# Patient Record
Sex: Male | Born: 2000 | Race: White | Hispanic: No | Marital: Single | Smoking: Never smoker
Health system: Southern US, Community
[De-identification: ages and names within clinical notes are randomized; demographics above are authoritative.]

## PROBLEM LIST (undated history)

## (undated) HISTORY — PX: FOREIGN BODY REMOVAL EAR: SHX5321

---
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
## 2019-02-16 ENCOUNTER — Other Ambulatory Visit: Payer: Self-pay

## 2019-02-16 ENCOUNTER — Encounter: Payer: Self-pay | Admitting: Emergency Medicine

## 2019-02-16 ENCOUNTER — Emergency Department
Admission: EM | Admit: 2019-02-16 | Discharge: 2019-02-16 | Disposition: A | Discharge status: Home / Self Care | Payer: Medicaid Other | Attending: Emergency Medicine | Admitting: Emergency Medicine

## 2019-02-16 DIAGNOSIS — L539 Erythematous condition, unspecified: Secondary | ICD-10-CM | POA: Diagnosis present

## 2019-02-16 DIAGNOSIS — Z79899 Other long term (current) drug therapy: Secondary | ICD-10-CM | POA: Diagnosis not present

## 2019-02-16 DIAGNOSIS — L089 Local infection of the skin and subcutaneous tissue, unspecified: Secondary | ICD-10-CM | POA: Diagnosis not present

## 2019-02-16 MED ORDER — CEPHALEXIN 500 MG PO CAPS: 500.0000 mg | ORAL_CAPSULE | Freq: Three times a day (TID) | ORAL | 0 refills | Status: DC

## 2019-02-16 NOTE — ED Notes (Signed)
See triage note   Presents with redness and some swelling to new tattoo area

## 2019-02-16 NOTE — ED Provider Notes (Signed)
[facility] Emergency Department Provider Note   ____________________________________________   First MD Initiated Contact with Patient 02/16/19 1023     (approximate)  I have reviewed the triage vital signs and the nursing notes.   HISTORY  Chief Complaint Arm Pain   HPI Mark Butler is a 19 y.o. male presents to the ED with complaint of redness around a new tattoo that he got approximately 4 days ago.  Patient states he tried a new method and has been noting some redness along the edges of the tattoo.  He is denies any fever or chills.     History reviewed. No pertinent past medical history.  There are no problems to display for this patient.   Past Surgical History:  Procedure Laterality Date  . FOREIGN BODY REMOVAL EAR    . KNEE ARTHROSCOPY WITH MENISCAL REPAIR Left 11/25/2014   Procedure: KNEE ARTHROSCOPY WITH MENISCAL REPAIR;  Surgeon: Corky Mull, MD;  Location: [facility] ORS;  Service: Orthopedics;  Laterality: Left;  . PATELLAR TENDON REPAIR Left 11/25/2014   Procedure: PATELLA TENDON REPAIR/ PATELLOFEMORAL LIGAMENT;  Surgeon: Corky Mull, MD;  Location: [facility] ORS;  Service: Orthopedics;  Laterality: Left;    Prior to Admission medications   Medication Sig Start Date End Date Taking? Authorizing Provider  albuterol (VENTOLIN HFA) 108 (90 Base) MCG/ACT inhaler Inhale 2 puffs into the lungs every 4 (four) hours as needed for wheezing or shortness of breath. 10/23/18   Duffy Bruce, MD  cephALEXin (KEFLEX) 500 MG capsule Take 1 capsule (500 mg total) by mouth 3 (three) times daily. 02/16/19   Johnn Hai, PA-C    Allergies Patient has no known allergies.  No family history on file.  Social History Social History   Tobacco Use  . Smoking status: Never Smoker  . Smokeless tobacco: Never Used  Substance Use Topics  . Alcohol use: Never  . Drug use: Never    Review of Systems Constitutional: No fever/chills  Cardiovascular: Denies chest pain. Respiratory: Denies shortness of breath. Musculoskeletal: Negative for back pain. Skin: Tattoos x2 with redness. Neurological: Negative for headaches ___________________________________________   PHYSICAL EXAM:  VITAL SIGNS: ED Triage Vitals  Enc Vitals Group     BP --      Pulse --      Resp --      Temp --      Temp src --      SpO2 --      Weight 02/16/19 1006 145 lb 1 oz (65.8 kg)     Height 02/16/19 1006 5\' 10"  (1.778 m)     Head Circumference --      Peak Flow --      Pain Score 02/16/19 1005 5     Pain Loc --      Pain Edu? --      Excl. in [place]? --    Constitutional: Alert and oriented. Well appearing and in no acute distress. Eyes: Conjunctivae are normal.  Head: Atraumatic. Neck: No stridor.   Cardiovascular:   Good peripheral circulation. Respiratory: Normal respiratory effort.  No retractions. Musculoskeletal: Moves upper and lower extremities without any difficulty.  Normal gait was noted. Neurologic:  Normal speech and language. No gross focal neurologic deficits are appreciated. No gait instability. Skin:  Skin is warm, dry and intact. No rash noted. Psychiatric: Mood and affect are normal. Speech and behavior are normal.  ____________________________________________   LABS (all labs ordered are listed, but only  abnormal results are displayed)  Labs Reviewed - No data to display   PROCEDURES  Procedure(s) performed (including Critical Care):  Procedures   ____________________________________________   INITIAL IMPRESSION / ASSESSMENT AND PLAN / ED COURSE  As part of my medical decision making, I reviewed the following data within the electronic MEDICAL RECORD NUMBER Notes from prior ED visits and [place] Controlled Substance Database  19 year old male presents to the ED with erythema in the area where he had tattoos done 4 days ago.  Patient denies any fever or chills.  There is been no drainage from the area.  Patient  denies any itching.  The edges of the tattoo are erythematous but no drainage is noted.  Patient was made aware that he needs to watch for any extension of the redness.  He was started on Keflex 500 mg 3 times daily for 7 days.  He was instructed to follow-up with his PCP or return to the emergency department if any severe worsening of his symptoms. ____________________________________________   FINAL CLINICAL IMPRESSION(S) / ED DIAGNOSES  Final diagnoses:  Local skin infection     ED Discharge Orders         Ordered    cephALEXin (KEFLEX) 500 MG capsule  3 times daily     02/16/19 1042           Note:  This document was prepared using Dragon voice recognition software and may include unintentional dictation errors.    Tommi Rumps, PA-C 02/16/19 1102    Concha Se, MD 02/17/19 1051

## 2019-02-16 NOTE — ED Triage Notes (Signed)
Tattoo done four days ago to right forearm.  Some redness noted and slight swelling to area.

## 2019-02-16 NOTE — Discharge Instructions (Addendum)
Follow-up with your primary care provider if any continued problems or concerns.  Keep the area clean and dry.  Begin taking antibiotics as directed until completely finished.  If there is any worsening of your skin infection such as fever, chills or extension of the redness return to the emergency department.

## 2019-12-05 ENCOUNTER — Emergency Department
Admission: EM | Admit: 2019-12-05 | Discharge: 2019-12-05 | Disposition: A | Discharge status: Home / Self Care | Payer: Medicaid Other | Attending: Emergency Medicine | Admitting: Emergency Medicine

## 2019-12-05 ENCOUNTER — Other Ambulatory Visit: Payer: Self-pay

## 2019-12-05 ENCOUNTER — Encounter: Payer: Self-pay | Admitting: Emergency Medicine

## 2019-12-05 DIAGNOSIS — K0889 Other specified disorders of teeth and supporting structures: Secondary | ICD-10-CM | POA: Diagnosis present

## 2019-12-05 MED ORDER — IBUPROFEN 600 MG PO TABS
600.0000 mg | ORAL_TABLET | Freq: Once | ORAL | Status: AC
  Administered 2019-12-05: 600 mg via ORAL
  Filled 2019-12-05: qty 1

## 2019-12-05 MED ORDER — IBUPROFEN 600 MG PO TABS: 600.0000 mg | ORAL_TABLET | Freq: Three times a day (TID) | ORAL | 0 refills | Status: DC | PRN

## 2019-12-05 MED ORDER — TRAMADOL HCL 50 MG PO TABS
50.0000 mg | ORAL_TABLET | Freq: Once | ORAL | Status: AC
  Administered 2019-12-05: 50 mg via ORAL
  Filled 2019-12-05: qty 1

## 2019-12-05 MED ORDER — AMOXICILLIN 500 MG PO CAPS: 500.0000 mg | ORAL_CAPSULE | Freq: Three times a day (TID) | ORAL | 0 refills | Status: DC

## 2019-12-05 NOTE — Discharge Instructions (Signed)
Follow discharge care instruction take medication as directed.  Follow-up with treating dentist for reevaluation.

## 2019-12-05 NOTE — ED Notes (Signed)
Patient to ED with complaint of jaw pain. States he had his wisdom teeth out October 25th and thinks it may be related to that. No facial swelling or lip swelling noted.

## 2019-12-05 NOTE — ED Triage Notes (Signed)
Pt to ED via POV, pt has swelling to the left side of his face. Pt had his wisdom teeth removed in October and thinks this may be related. Pt states that the area is painful and he is not able to open his mouth all the way due to the pain and swelling. Pt is in NAD.

## 2019-12-05 NOTE — ED Provider Notes (Signed)
[facility] Emergency Department Provider Note   ____________________________________________   First MD Initiated Contact with Patient 12/05/19 [phone]     (approximate)  I have reviewed the triage vital signs and the nursing notes.   HISTORY  Chief Complaint Dental Pain    HPI Mark Butler is a 19 y.o. male patient complain of edema to the lateral left molar area.  Patient states pain started a few days ago.  Patient had bilateral upper and lower wisdom tooth extraction last month.  Patient said he was doing fine until a few days ago.  Patient had no complaint with the right side.  Patient pain is a 4/10.  Described pain as "achy".  Patient state unable to fully open his mouth secondary to pain.  No palliative measures for complaint.         History reviewed. No pertinent past medical history.  There are no problems to display for this patient.   Past Surgical History:  Procedure Laterality Date  . FOREIGN BODY REMOVAL EAR    . KNEE ARTHROSCOPY WITH MENISCAL REPAIR Left 11/25/2014   Procedure: KNEE ARTHROSCOPY WITH MENISCAL REPAIR;  Surgeon: Christena Flake, MD;  Location: [facility] ORS;  Service: Orthopedics;  Laterality: Left;  . PATELLAR TENDON REPAIR Left 11/25/2014   Procedure: PATELLA TENDON REPAIR/ PATELLOFEMORAL LIGAMENT;  Surgeon: Christena Flake, MD;  Location: [facility] ORS;  Service: Orthopedics;  Laterality: Left;    Prior to Admission medications   Medication Sig Start Date End Date Taking? Authorizing Provider  albuterol (VENTOLIN HFA) 108 (90 Base) MCG/ACT inhaler Inhale 2 puffs into the lungs every 4 (four) hours as needed for wheezing or shortness of breath. 10/23/18   Shaune Pollack, MD  amoxicillin (AMOXIL) 500 MG capsule Take 1 capsule (500 mg total) by mouth 3 (three) times daily. 12/05/19   Joni Reining, PA-C  cephALEXin (KEFLEX) 500 MG capsule Take 1 capsule (500 mg total) by mouth 3 (three) times daily. 02/16/19   Tommi Rumps, PA-C  ibuprofen (ADVIL) 600 MG tablet Take 1 tablet (600 mg total) by mouth every 8 (eight) hours as needed. 12/05/19   Joni Reining, PA-C    Allergies Patient has no known allergies.  No family history on file.  Social History Social History   Tobacco Use  . Smoking status: Never Smoker  . Smokeless tobacco: Never Used  Vaping Use  . Vaping Use: Every day  Substance Use Topics  . Alcohol use: Never  . Drug use: Yes    Types: Marijuana    Review of Systems Constitutional: No fever/chills Eyes: No visual changes. ENT: No sore throat.  Left dental pain. Cardiovascular: Denies chest pain. Respiratory: Denies shortness of breath. Gastrointestinal: No abdominal pain.  No nausea, no vomiting.  No diarrhea.  No constipation. Genitourinary: Negative for dysuria. Musculoskeletal: Negative for back pain. Skin: Negative for rash. Neurological: Negative for headaches, focal weakness or numbness.   ____________________________________________   PHYSICAL EXAM:  VITAL SIGNS: ED Triage Vitals  Enc Vitals Group     BP 12/05/19 0953 (!) 96/55     Pulse Rate 12/05/19 0953 82     Resp 12/05/19 0953 16     Temp 12/05/19 0953 97.7 F (36.5 C)     Temp Source 12/05/19 0953 Oral     SpO2 12/05/19 0953 97 %     Weight 12/05/19 0951 138 lb (62.6 kg)     Height 12/05/19 0951 5\' 11"  (1.803 m)  Head Circumference --      Peak Flow --      Pain Score 12/05/19 0950 4     Pain Loc --      Pain Edu? --      Excl. in [place]? --    Constitutional: Alert and oriented. Well appearing and in no acute distress. Mouth/Throat: Mucous membranes are moist.  Oropharynx non-erythematous. Hematological/Lymphatic/Immunilogical: No cervical lymphadenopathy. Cardiovascular: Normal rate, regular rhythm. Grossly normal heart sounds.  Good peripheral circulation. Respiratory: Normal respiratory effort.  No retractions. Lungs CTAB. Skin:  Skin is warm, dry and intact. No rash noted.  Mild edema left  lateral mandible area. Psychiatric: Mood and affect are normal. Speech and behavior are normal.  ____________________________________________   LABS (all labs ordered are listed, but only abnormal results are displayed)  Labs Reviewed - No data to display ____________________________________________  EKG   ____________________________________________  RADIOLOGY I, Joni Reining, personally viewed and evaluated these images (plain radiographs) as part of my medical decision making, as well as reviewing the written report by the radiologist.  ED MD interpretation:    Official radiology report(s): No results found.  ____________________________________________   PROCEDURES  Procedure(s) performed (including Critical Care):  Procedures   ____________________________________________   INITIAL IMPRESSION / ASSESSMENT AND PLAN / ED COURSE  As part of my medical decision making, I reviewed the following data within the electronic MEDICAL RECORD NUMBER         Patient presents with left lateral molar pain for 2 days.  Patient denies fever associated complaint.  Patient complaint physical exam consistent with dental pain.  Patient given a prophylactic prescription for amoxicillin.  Patient also given prescription for ibuprofen.  Patient will follow up with treating dentist by calling for reevaluation appointment in 2 days.  Return to ED if condition worsens.      ____________________________________________   FINAL CLINICAL IMPRESSION(S) / ED DIAGNOSES  Final diagnoses:  Pain, dental     ED Discharge Orders         Ordered    amoxicillin (AMOXIL) 500 MG capsule  3 times daily        12/05/19 1002    ibuprofen (ADVIL) 600 MG tablet  Every 8 hours PRN        12/05/19 1002          *Please note:  [name] was evaluated in Emergency Department on 12/05/2019 for the symptoms described in the history of present illness. He was evaluated in the context of  the global COVID-19 pandemic, which necessitated consideration that the patient might be at risk for infection with the SARS-CoV-2 virus that causes COVID-19. Institutional protocols and algorithms that pertain to the evaluation of patients at risk for COVID-19 are in a state of rapid change based on information released by regulatory bodies including the CDC and federal and state organizations. These policies and algorithms were followed during the patient's care in the ED.  Some ED evaluations and interventions may be delayed as a result of limited staffing during and the pandemic.*   Note:  This document was prepared using Dragon voice recognition software and may include unintentional dictation errors.    Joni Reining, PA-C 12/05/19 1007    Dionne Bucy, MD 12/05/19 1049

## 2020-04-19 IMAGING — CR DG CHEST 2V
2 series · 2 of 2 positions shown · non-contrast
Comparison: None.

CLINICAL DATA: Chest pain

EXAM:
CHEST - 2 VIEW

[chest pa]
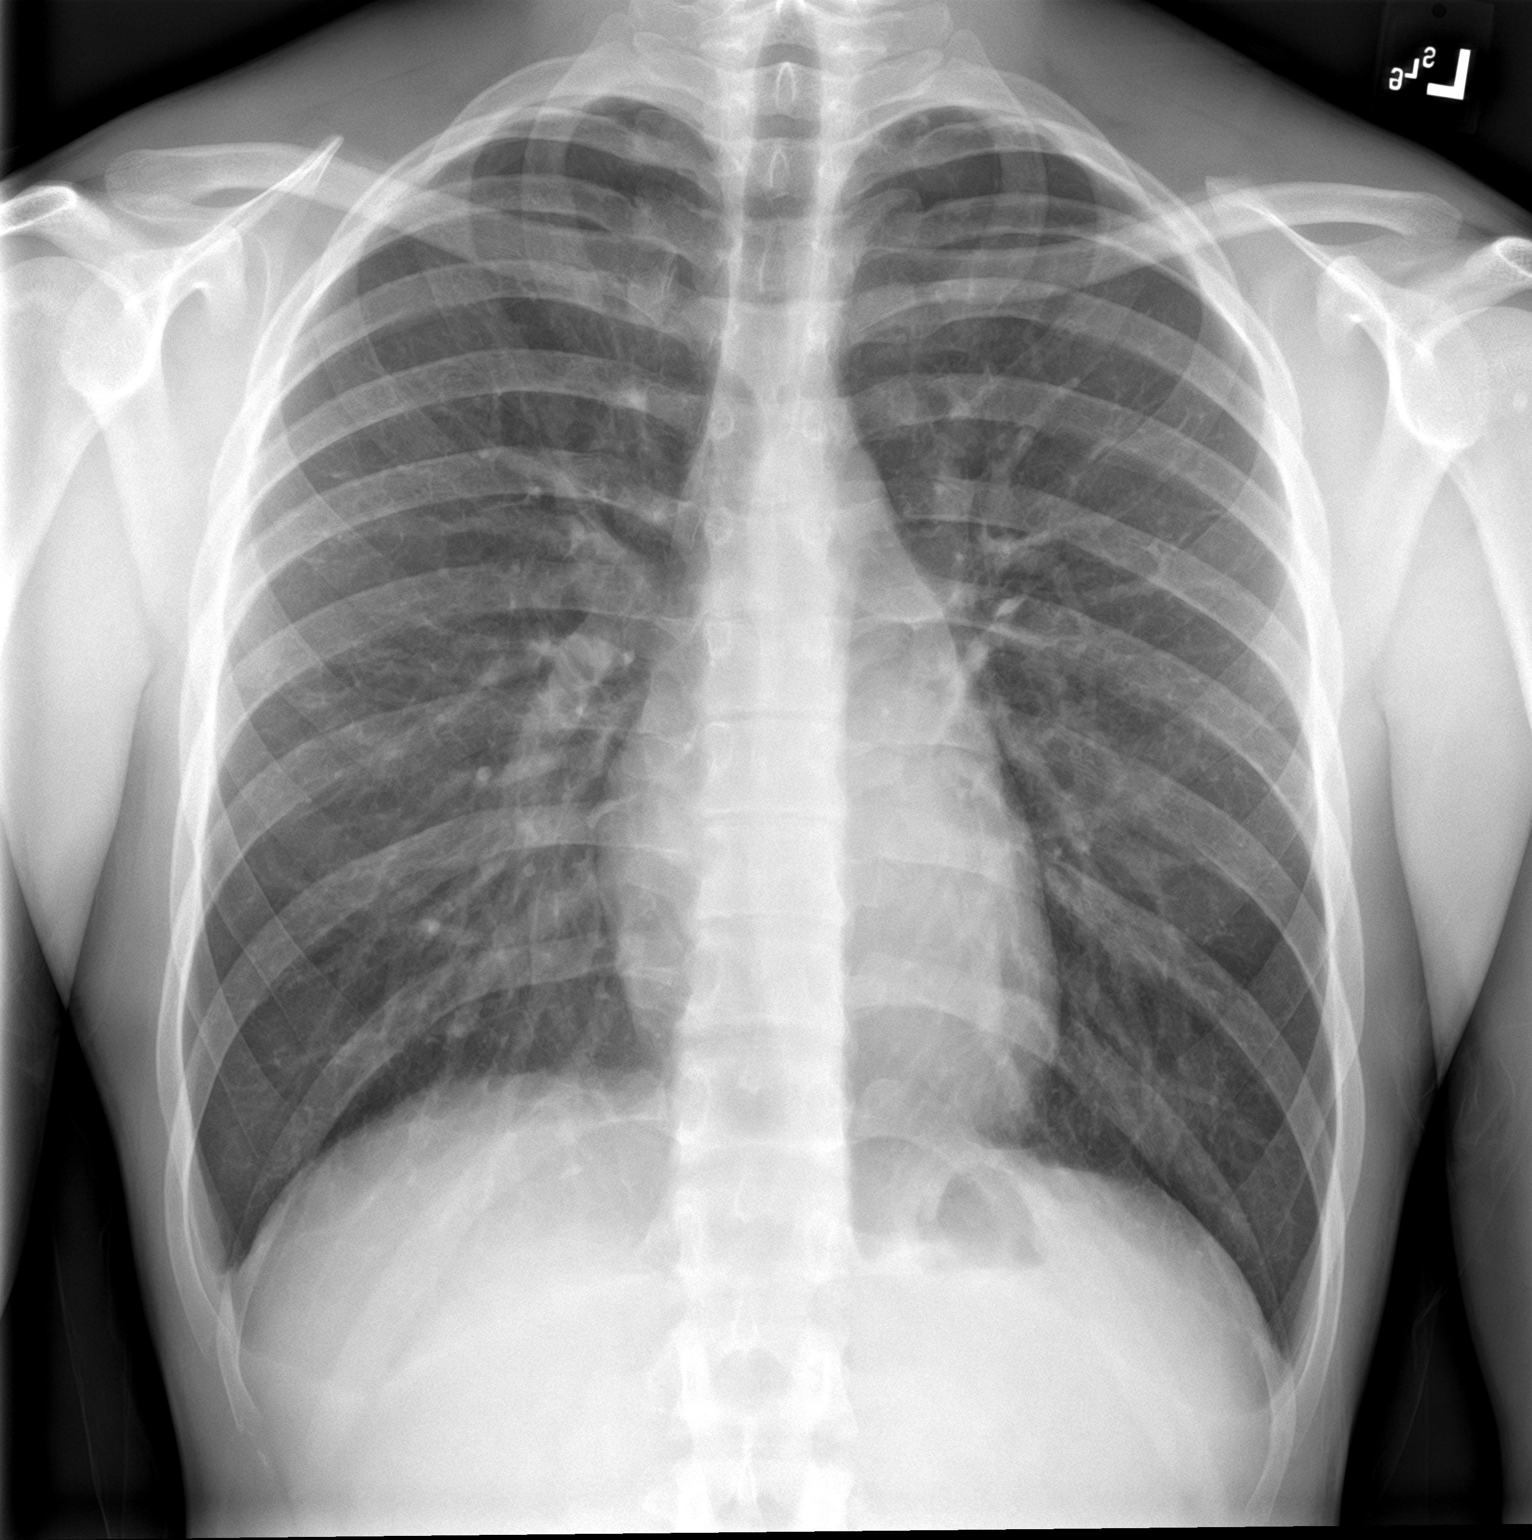

[chest lat]
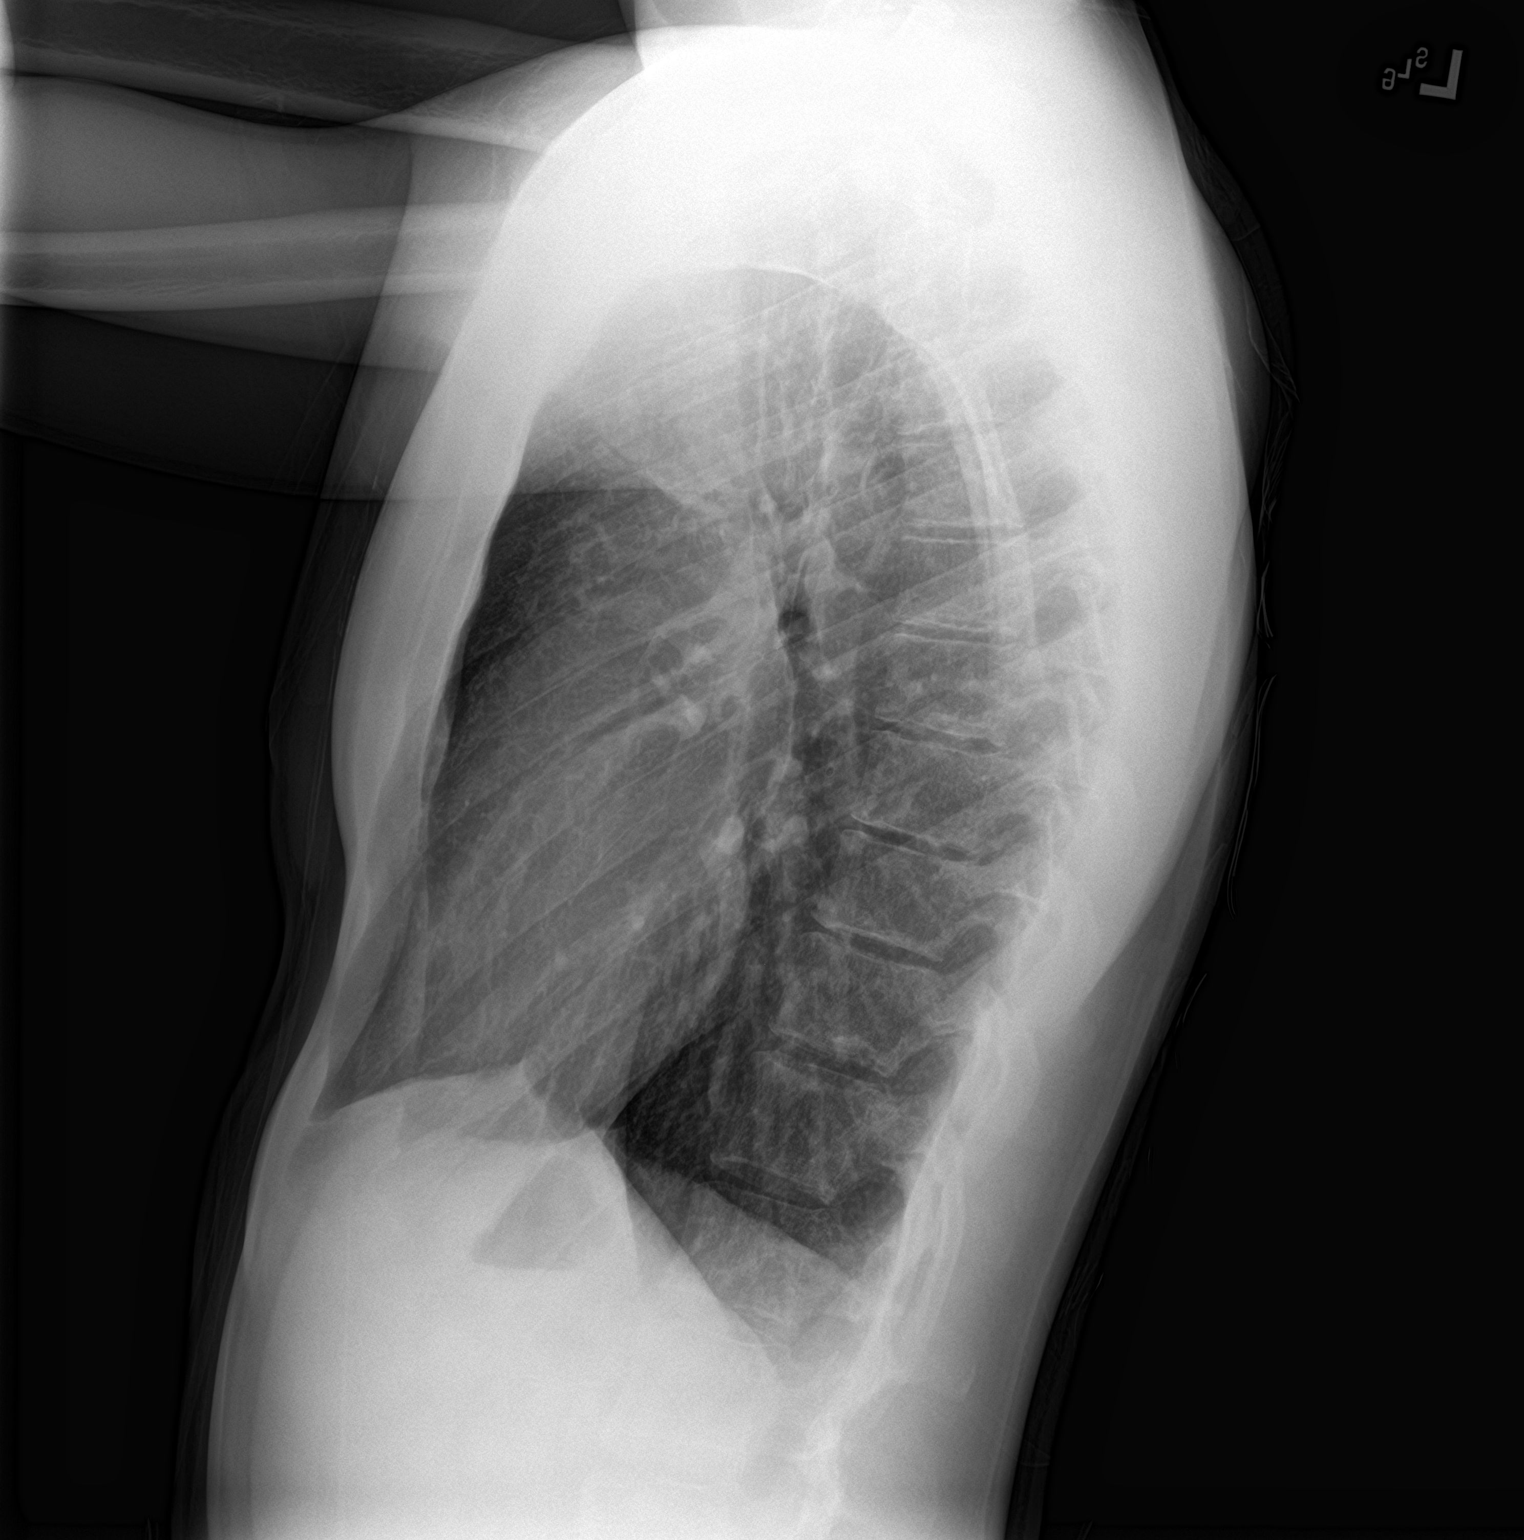

[2 of 2 positions shown; findings below may reference images not displayed]

FINDINGS: The heart size and mediastinal contours are within normal limits.
Both lungs are clear. The visualized skeletal structures are
unremarkable.
IMPRESSION: No acute abnormality of the lungs.

## 2022-11-06 ENCOUNTER — Ambulatory Visit: Payer: Medicaid Other | Admitting: Internal Medicine

## 2022-11-14 ENCOUNTER — Ambulatory Visit: Payer: Medicaid Other | Admitting: Internal Medicine

## 2023-06-25 ENCOUNTER — Ambulatory Visit: Payer: Self-pay

## 2023-06-25 NOTE — Telephone Encounter (Signed)
 Copied from CRM [id number]. Topic: Clinical - Red Word Triage >> Jun 25, 2023 12:05 PM Rosamond Comes wrote: Red Word that prompted transfer to Nurse Triage: patient calling to establish care with new provider, patient is tired all the time, low energy, on the weekend patient will sleep for 10 hours.   Reason for Disposition  Fatigue is a chronic symptom (recurrent or ongoing AND present > 4 weeks)    Symptoms since the beginning of the year, scheduled for earliest new patient appointment at preferred location  Answer Assessment - Initial Assessment Questions 1. DESCRIPTION: "Describe how you are feeling."     "I just feel tired all the time" 2. SEVERITY: "How bad is it?"  "Can you stand and walk?"   - MILD (0-3): Feels weak or tired, but does not interfere with work, school or normal activities.   - MODERATE (4-7): Able to stand and walk; weakness interferes with work, school, or normal activities.   - SEVERE (8-10): Unable to stand or walk; unable to do usual activities.     Mild  3. ONSET: "When did these symptoms begin?" (e.g., hours, days, weeks, months)     Beginning of the year 4. CAUSE: "What do you think is causing the weakness or fatigue?" (e.g., not drinking enough fluids, medical problem, trouble sleeping)     Unsure 5. NEW MEDICINES:  "Have you started on any new medicines recently?" (e.g., opioid pain medicines, benzodiazepines, muscle relaxants, antidepressants, antihistamines, neuroleptics, beta blockers)     No 6. OTHER SYMPTOMS: "Do you have any other symptoms?" (e.g., chest pain, fever, cough, SOB, vomiting, diarrhea, bleeding, other areas of pain)     No  Protocols used: Weakness (Generalized) and Fatigue-A-AH   FYI Only or Action Required?: FYI only for provider  Called Nurse Triage reporting Fatigue. Symptoms began several months ago. Interventions attempted: Nothing. Symptoms are: stable.  Triage Disposition: See PCP Within 2 Weeks (New patient appointment made for  patient on 8/7 and added to wait list)  Patient/caregiver understands and will follow disposition?: Yes

## 2023-08-22 ENCOUNTER — Encounter: Payer: Self-pay | Admitting: Nurse Practitioner

## 2023-08-22 ENCOUNTER — Ambulatory Visit (INDEPENDENT_AMBULATORY_CARE_PROVIDER_SITE_OTHER): Payer: Self-pay | Admitting: Nurse Practitioner

## 2023-08-22 VITALS — BP 122/80 | HR 73 | Temp 97.9°F | Ht 69.25 in | Wt 167.6 lb

## 2023-08-22 DIAGNOSIS — G478 Other sleep disorders: Secondary | ICD-10-CM | POA: Insufficient documentation

## 2023-08-22 DIAGNOSIS — R5383 Other fatigue: Secondary | ICD-10-CM | POA: Insufficient documentation

## 2023-08-22 DIAGNOSIS — Z114 Encounter for screening for human immunodeficiency virus [HIV]: Secondary | ICD-10-CM

## 2023-08-22 DIAGNOSIS — Z0000 Encounter for general adult medical examination without abnormal findings: Secondary | ICD-10-CM | POA: Diagnosis not present

## 2023-08-22 DIAGNOSIS — Z1159 Encounter for screening for other viral diseases: Secondary | ICD-10-CM

## 2023-08-22 DIAGNOSIS — Z131 Encounter for screening for diabetes mellitus: Secondary | ICD-10-CM

## 2023-08-22 DIAGNOSIS — Z13220 Encounter for screening for lipoid disorders: Secondary | ICD-10-CM

## 2023-08-22 DIAGNOSIS — Z113 Encounter for screening for infections with a predominantly sexual mode of transmission: Secondary | ICD-10-CM

## 2023-08-22 NOTE — Assessment & Plan Note (Signed)
 Epworth Sleepiness Scale positive was 14.  Will check metabolic causes if all negative consider at home sleep test

## 2023-08-22 NOTE — Assessment & Plan Note (Signed)
 Multifactorial positive Epworth Sleepiness Scale.  Patient is not obese but does snore per his report.  No history of OSA in the family.  Will check testosterone , CBC, electrolytes, B12, vitamin D , TSH.  Pending lab results

## 2023-08-22 NOTE — Patient Instructions (Signed)
 Nice to see you today  Make a fasting lab appointment with in the next 2 weeks before 930am Follow up with me in 1 year, sooner if you need me  Once I have the labs back I will be in touch

## 2023-08-22 NOTE — Assessment & Plan Note (Signed)
 Discussed age-appropriate immunizations and screening exams.  Did review patient's personal, surgical, social, family histories.  Patient is up-to-date on all age-appropriate vaccinations he would like.  Patient is too young for CRC screening or prostate cancer screening.  Patient would like screening for STIs order placed.  Patient was given information at discharge about preventative healthcare maintenance with anticipatory guidance.

## 2023-08-22 NOTE — Progress Notes (Signed)
 New Patient Office Visit  Subjective    Patient ID: Mark Butler, male    DOB: July 28, 2000  Age: 23 y.o. MRN: [id number]  CC:  Chief Complaint  Patient presents with   Establish Care    HPI Mark Butler presents to establish care   Fatigue: states that it started about 1 year ago. States that he lost energy to do stuff after [employer]. States that he does not have a demanding job. He does go to [employer] and gym. States that if he does not have anything to do he will slepp for 10  hours and then go back to bed Does not feel rested and does nosre. No PND  for complete physical and follow up of chronic conditions.  Immunizations: -Tetanus: Completed in 2019 in [place]  -Influenza: out of season -Shingles: too young, currently average risk  -Pneumonia: too young  HPV: utd Covid: refused   Diet: Fair diet. He is eating 3-4 meals a day and will have some snacks. He will dirnk water, electrolytes, 1 caffiene drink a day (energy) Exercise: No regular exercise. Jujitsu is 4 days a week at 2 hours. 3 days at the gym for 45 mins   Eye exam:  Glasses. Needs updating   Dental exam: Needs updating     Colonoscopy: too young, currently average risk  Lung Cancer Screening: NA  PSA: too young, currently average risk   STI: would like testing   Sleep: statse that he has troule getting gto sleep he will go to 1130 and get up at 615. States that he will toss and turn and also can't turn the mind. States that he has tried mealtonin but feel groggy the next moring     08/22/2023    3:22 PM  PHQ9 SCORE ONLY  PHQ-9 Total Score 0       08/22/2023    3:22 PM  GAD 7 : Generalized Anxiety Score  Nervous, Anxious, on Edge 0  Control/stop worrying 0  Worry too much - different things 0  Trouble relaxing 0  Restless 0  Easily annoyed or irritable 0  Afraid - awful might happen 0  Total GAD 7 Score 0  Anxiety Difficulty Not difficult at all        Outpatient Encounter  Medications as of 08/22/2023  Medication Sig   [DISCONTINUED] albuterol  (VENTOLIN  HFA) 108 (90 Base) MCG/ACT inhaler Inhale 2 puffs into the lungs every 4 (four) hours as needed for wheezing or shortness of breath.   [DISCONTINUED] amoxicillin  (AMOXIL ) 500 MG capsule Take 1 capsule (500 mg total) by mouth 3 (three) times daily.   [DISCONTINUED] cephALEXin  (KEFLEX ) 500 MG capsule Take 1 capsule (500 mg total) by mouth 3 (three) times daily.   [DISCONTINUED] ibuprofen  (ADVIL ) 600 MG tablet Take 1 tablet (600 mg total) by mouth every 8 (eight) hours as needed.   No facility-administered encounter medications on file as of 08/22/2023.    No past medical history on file.  Past Surgical History:  Procedure Laterality Date   FOREIGN BODY REMOVAL EAR     KNEE ARTHROSCOPY WITH MENISCAL REPAIR Left 11/25/2014   Procedure: KNEE ARTHROSCOPY WITH MENISCAL REPAIR;  Surgeon: Norleen JINNY [name], MD;  Location: [facility];  Service: Orthopedics;  Laterality: Left;   PATELLAR TENDON REPAIR Left 11/25/2014   Procedure: PATELLA TENDON REPAIR/ PATELLOFEMORAL LIGAMENT;  Surgeon: Norleen JINNY [name], MD;  Location: [facility];  Service: Orthopedics;  Laterality: Left;    No family history on  file.  Social History   Socioeconomic History   Marital status: Single    Spouse name: Not on file   Number of children: 0   Years of education: Not on file   Highest education level: Not on file  Occupational History   Not on file  Tobacco Use   Smoking status: Never   Smokeless tobacco: Never   Tobacco comments:    Nicotine pouches   Vaping Use   Vaping status: Every Day   Substances: Nicotine, Flavoring   Devices: Nicotine  Substance and Sexual Activity   Alcohol use: Yes    Alcohol/week: 4.0 - 5.0 standard drinks of alcohol    Types: 4 - 5 Standard drinks or equivalent per week    Comment: beer   Drug use: Not Currently    Types: Marijuana   Sexual activity: Not on file  Other Topics Concern   Not on file  Social  History Narrative   Fulltime: [place] [employer] works   Social Drivers of Corporate investment banker Strain: Not on [employer] Insecurity: Not on file  Transportation Needs: Not on file  Physical Activity: Not on file  Stress: Not on file  Social Connections: Not on file  Intimate Partner Violence: Not on file    Review of Systems  Constitutional:  Negative for chills and fever.  Respiratory:  Negative for shortness of breath.   Cardiovascular:  Negative for chest pain and leg swelling.  Gastrointestinal:  Negative for abdominal pain, blood in stool, constipation, diarrhea, nausea and vomiting.       BM daily   Genitourinary:  Negative for dysuria and hematuria.  Neurological:  Negative for dizziness, tingling and headaches.  Psychiatric/Behavioral:  Negative for hallucinations and suicidal ideas.         Objective    BP 122/80   Pulse 73   Temp 97.9 F (36.6 C) (Oral)   Ht 5' 9.25 (1.759 m)   Wt 167 lb 9.6 oz (76 kg)   SpO2 97%   BMI 24.57 kg/m   Physical Exam Vitals and nursing note reviewed.  Constitutional:      Appearance: Normal appearance.  HENT:     Right Ear: Tympanic membrane, ear canal and external ear normal.     Left Ear: Tympanic membrane, ear canal and external ear normal.     Mouth/Throat:     Mouth: Mucous membranes are moist.     Pharynx: Oropharynx is clear.  Eyes:     Extraocular Movements: Extraocular movements intact.     Pupils: Pupils are equal, round, and reactive to light.  Cardiovascular:     Rate and Rhythm: Normal rate and regular rhythm.     Pulses: Normal pulses.     Heart sounds: Normal heart sounds.  Pulmonary:     Effort: Pulmonary effort is normal.     Breath sounds: Normal breath sounds.  Abdominal:     General: Bowel sounds are normal. There is no distension.     Palpations: There is no mass.     Tenderness: There is no abdominal tenderness.     Hernia: No hernia is present.  Genitourinary:    Comments:  deferred Musculoskeletal:     Right lower leg: No edema.     Left lower leg: No edema.  Lymphadenopathy:     Cervical: No cervical adenopathy.  Skin:    General: Skin is warm.  Neurological:     General: No focal deficit present.  Mental Status: He is alert.     Deep Tendon Reflexes:     Reflex Scores:      Bicep reflexes are 2+ on the right side and 2+ on the left side.      Patellar reflexes are 2+ on the right side and 2+ on the left side.    Comments: Bilateral upper and lower extremity strength 5/5  Psychiatric:        Mood and Affect: Mood normal.        Behavior: Behavior normal.        Thought Content: Thought content normal.        Judgment: Judgment normal.         Assessment & Plan:   Problem List Items Addressed This Visit       Nervous and Auditory   Non-restorative sleep   Epworth Sleepiness Scale positive was 14.  Will check metabolic causes if all negative consider at home sleep test        Other   Preventative health care - Primary   Discussed age-appropriate immunizations and screening exams.  Did review patient's personal, surgical, social, family histories.  Patient is up-to-date on all age-appropriate vaccinations he would like.  Patient is too young for CRC screening or prostate cancer screening.  Patient would like screening for STIs order placed.  Patient was given information at discharge about preventative healthcare maintenance with anticipatory guidance.      Relevant Orders   CBC with Differential/Platelet   Comprehensive metabolic panel with GFR   TSH   Fatigue   Multifactorial positive Epworth Sleepiness Scale.  Patient is not obese but does snore per his report.  No history of OSA in the family.  Will check testosterone , CBC, electrolytes, B12, vitamin D , TSH.  Pending lab results      Relevant Orders   CBC with Differential/Platelet   TSH   VITAMIN D  25 Hydroxy (Vit-D Deficiency, Fractures)   Vitamin B12   Testosterone     Other Visit Diagnoses       Encounter for hepatitis C screening test for low risk patient       Relevant Orders   Hepatitis C antibody     Encounter for screening for HIV       Relevant Orders   HIV antibody (with reflex)     Screening for lipid disorders       Relevant Orders   Lipid panel     Screening for diabetes mellitus       Relevant Orders   Hemoglobin A1c     Screening examination for STI       Relevant Orders   HIV antibody (with reflex)   RPR   Urine cytology ancillary only       Return in about 1 year (around 08/21/2024) for CPE and Labs.   Adina Crandall, NP

## 2023-08-23 ENCOUNTER — Other Ambulatory Visit (INDEPENDENT_AMBULATORY_CARE_PROVIDER_SITE_OTHER)

## 2023-08-23 ENCOUNTER — Other Ambulatory Visit (HOSPITAL_COMMUNITY)
Admission: RE | Admit: 2023-08-23 | Discharge: 2023-08-23 | Disposition: A | Discharge status: Home / Self Care | Source: Ambulatory Visit | Attending: Nurse Practitioner | Admitting: Nurse Practitioner

## 2023-08-23 DIAGNOSIS — Z131 Encounter for screening for diabetes mellitus: Secondary | ICD-10-CM

## 2023-08-23 DIAGNOSIS — Z113 Encounter for screening for infections with a predominantly sexual mode of transmission: Secondary | ICD-10-CM

## 2023-08-23 DIAGNOSIS — Z1159 Encounter for screening for other viral diseases: Secondary | ICD-10-CM

## 2023-08-23 DIAGNOSIS — Z114 Encounter for screening for human immunodeficiency virus [HIV]: Secondary | ICD-10-CM

## 2023-08-23 DIAGNOSIS — R5383 Other fatigue: Secondary | ICD-10-CM | POA: Diagnosis not present

## 2023-08-23 DIAGNOSIS — Z13220 Encounter for screening for lipoid disorders: Secondary | ICD-10-CM | POA: Diagnosis not present

## 2023-08-23 DIAGNOSIS — Z0000 Encounter for general adult medical examination without abnormal findings: Secondary | ICD-10-CM | POA: Diagnosis not present

## 2023-08-23 LAB — CBC WITH DIFFERENTIAL/PLATELET
Basophils Absolute: 0.1 K/uL (ref 0.0–0.1)
Basophils Relative: 1.1 % (ref 0.0–3.0)
Eosinophils Absolute: 0.2 K/uL (ref 0.0–0.7)
Eosinophils Relative: 3.6 % (ref 0.0–5.0)
HCT: 47.4 % (ref 39.0–52.0)
Hemoglobin: 16.5 g/dL (ref 13.0–17.0)
Lymphocytes Relative: 31.4 % (ref 12.0–46.0)
Lymphs Abs: 1.9 K/uL (ref 0.7–4.0)
MCHC: 34.8 g/dL (ref 30.0–36.0)
MCV: 90.4 fl (ref 78.0–100.0)
Monocytes Absolute: 0.7 K/uL (ref 0.1–1.0)
Monocytes Relative: 11.9 % (ref 3.0–12.0)
Neutro Abs: 3.1 K/uL (ref 1.4–7.7)
Neutrophils Relative %: 52 % (ref 43.0–77.0)
Platelets: 181 K/uL (ref 150.0–400.0)
RBC: 5.24 Mil/uL (ref 4.22–5.81)
RDW: 12.8 % (ref 11.5–15.5)
WBC: 6 K/uL (ref 4.0–10.5)

## 2023-08-23 LAB — COMPREHENSIVE METABOLIC PANEL WITH GFR
ALT: 18 U/L (ref 0–53)
AST: 20 U/L (ref 0–37)
Albumin: 4.7 g/dL (ref 3.5–5.2)
Alkaline Phosphatase: 130 U/L — ABNORMAL HIGH (ref 39–117)
BUN: 9 mg/dL (ref 6–23)
CO2: 32 meq/L (ref 19–32)
Calcium: 9.6 mg/dL (ref 8.4–10.5)
Chloride: 98 meq/L (ref 96–112)
Creatinine, Ser: 0.9 mg/dL (ref 0.40–1.50)
GFR: 119.76 mL/min (ref >60.00)
Glucose, Bld: 79 mg/dL (ref 70–99)
Potassium: 4.4 meq/L (ref 3.5–5.1)
Sodium: 139 meq/L (ref 135–145)
Total Bilirubin: 0.7 mg/dL (ref 0.2–1.2)
Total Protein: 7.2 g/dL (ref 6.0–8.3)

## 2023-08-23 LAB — LIPID PANEL
Cholesterol: 131 mg/dL (ref 0–200)
HDL: 45.7 mg/dL (ref >39.00)
LDL Cholesterol: 67 mg/dL (ref 0–99)
NonHDL: 84.93
Total CHOL/HDL Ratio: 3
Triglycerides: 89 mg/dL (ref 0.0–149.0)
VLDL: 17.8 mg/dL (ref 0.0–40.0)

## 2023-08-23 LAB — TESTOSTERONE: Testosterone: 593.46 ng/dL (ref 300.00–890.00)

## 2023-08-23 LAB — HEMOGLOBIN A1C: Hgb A1c MFr Bld: 5.2 % (ref 4.6–6.5)

## 2023-08-23 LAB — VITAMIN B12: Vitamin B-12: 483 pg/mL (ref 211–911)

## 2023-08-23 LAB — VITAMIN D 25 HYDROXY (VIT D DEFICIENCY, FRACTURES): VITD: 29.3 ng/mL — ABNORMAL LOW (ref 30.00–100.00)

## 2023-08-23 LAB — TSH: TSH: 1.11 u[IU]/mL (ref 0.35–5.50)

## 2023-08-24 LAB — RPR: RPR Ser Ql: NONREACTIVE

## 2023-08-24 LAB — HIV ANTIBODY (ROUTINE TESTING W REFLEX): HIV 1&2 Ab, 4th Generation: NONREACTIVE

## 2023-08-24 LAB — HEPATITIS C ANTIBODY: Hepatitis C Ab: NONREACTIVE

## 2023-08-26 LAB — URINE CYTOLOGY ANCILLARY ONLY
Chlamydia: NEGATIVE
Comment: NEGATIVE
Comment: NEGATIVE
Comment: NORMAL
Neisseria Gonorrhea: NEGATIVE
Trichomonas: NEGATIVE

## 2023-08-27 ENCOUNTER — Ambulatory Visit: Payer: Self-pay | Admitting: Nurse Practitioner

## 2023-08-27 DIAGNOSIS — E559 Vitamin D deficiency, unspecified: Secondary | ICD-10-CM | POA: Insufficient documentation

## 2023-10-04 ENCOUNTER — Encounter: Payer: Self-pay | Admitting: Family Medicine

## 2023-10-04 ENCOUNTER — Ambulatory Visit: Payer: Self-pay

## 2023-10-04 ENCOUNTER — Ambulatory Visit: Payer: Self-pay | Admitting: Family Medicine

## 2023-10-04 ENCOUNTER — Ambulatory Visit: Admitting: Family Medicine

## 2023-10-04 VITALS — BP 116/72 | HR 70 | Temp 97.9°F | Ht 69.25 in | Wt 166.5 lb

## 2023-10-04 DIAGNOSIS — R1031 Right lower quadrant pain: Secondary | ICD-10-CM | POA: Diagnosis not present

## 2023-10-04 LAB — POC URINALSYSI DIPSTICK (AUTOMATED)
Blood, UA: NEGATIVE
Glucose, UA: NEGATIVE
Leukocytes, UA: NEGATIVE
Nitrite, UA: NEGATIVE
Protein, UA: POSITIVE — AB
Spec Grav, UA: 1.025 (ref 1.010–1.025)
Urobilinogen, UA: 1 U/dL
pH, UA: 6.5 (ref 5.0–8.0)

## 2023-10-04 NOTE — Progress Notes (Signed)
 Ph: [phone] Fax: [phone]   Patient ID: Mark Butler, male    DOB: 2000-03-24, 24 y.o.   MRN: [id number]  This visit was conducted in person.  BP 116/72   Pulse 70   Temp 97.9 F (36.6 C) (Oral)   Ht 5' 9.25 (1.759 m)   Wt 166 lb 8 oz (75.5 kg)   SpO2 96%   BMI 24.41 kg/m    CC: abd pain  Subjective:   HPI: Mark Butler is a 23 y.o. male presenting on 10/04/2023 for Abdominal Pain (LRQ pain. Started 1 month ago. Sharp pain. Took  /Ibuprofen  today . Pain level is a 6 today. No N/v or diarrhea./)   1 mo h/o intermittent RLQ abd pain described as sharp cramping and stinging. Latest felt this this morning - woke him up from sleep this morning at 6am. Has radiation of pain down into groin, also had swelling of R testicle this morning. Had to leave work early this morning due to pain. Laying supine and ibuprofen  helped. Swelling has since resolved.   No fevers/chills, nausea/vomiting, diarrhea, constipation, bowel changes, blood in stool, urinary symptoms such as dysuria, urgency, frequency, hematuria, no flank pain, urethral discharge.   Currently not sexually active. 2 partners in the past year.  No STD concerns - just completed STD testing last month - not sexually active since then.   Works for [employer] - lots of walking at work. He does regularly work out at gym with weight lifting for the past 2 yrs. Also enjoys jiu-jitsu     Relevant past medical, surgical, family and social history reviewed and updated as indicated. Interim medical history since our last visit reviewed. Allergies and medications reviewed and updated. No outpatient medications prior to visit.   No facility-administered medications prior to visit.     Per HPI unless specifically indicated in ROS section below Review of Systems  Objective:  BP 116/72   Pulse 70   Temp 97.9 F (36.6 C) (Oral)   Ht 5' 9.25 (1.759 m)   Wt 166 lb 8 oz (75.5 kg)   SpO2 96%    BMI 24.41 kg/m   Wt Readings from Last 3 Encounters:  10/04/23 166 lb 8 oz (75.5 kg)  08/22/23 167 lb 9.6 oz (76 kg)  12/05/19 138 lb (62.6 kg)      Physical Exam Vitals and nursing note reviewed.  Constitutional:      Appearance: Normal appearance. He is not ill-appearing.  HENT:     Mouth/Throat:     Mouth: Mucous membranes are moist.     Pharynx: Oropharynx is clear. No oropharyngeal exudate or posterior oropharyngeal erythema.  Eyes:     Extraocular Movements: Extraocular movements intact.     Pupils: Pupils are equal, round, and reactive to light.  Cardiovascular:     Rate and Rhythm: Normal rate and regular rhythm.     Pulses: Normal pulses.     Heart sounds: Normal heart sounds. No murmur heard. Pulmonary:     Effort: Pulmonary effort is normal. No respiratory distress.     Breath sounds: Normal breath sounds. No wheezing, rhonchi or rales.  Abdominal:     General: Bowel sounds are normal. There is no distension.     Palpations: Abdomen is soft. There is no mass.     Tenderness: There is abdominal tenderness (mild discomfort to palpation) in the right lower quadrant. There is no right CVA tenderness, left CVA tenderness, guarding or  rebound. Negative signs include Murphy's sign.     Hernia: A hernia is present. Hernia is present in the right inguinal area (laxity/fullness present to R groin with cough). There is no hernia in the umbilical area or left inguinal area.  Musculoskeletal:     Right lower leg: No edema.     Left lower leg: No edema.  Skin:    General: Skin is warm and dry.     Findings: No rash.  Neurological:     Mental Status: He is alert.  Psychiatric:        Mood and Affect: Mood normal.        Behavior: Behavior normal.       Results for orders placed or performed in visit on 08/23/23  Testosterone    Collection Time: 08/23/23  8:26 AM  Result Value Ref Range   Testosterone  593.46 300.00 - 890.00 ng/dL  RPR   Collection Time: 08/23/23  8:26 AM   Result Value Ref Range   RPR Ser Ql NON-REACTIVE NON-REACTIVE  Vitamin B12   Collection Time: 08/23/23  8:26 AM  Result Value Ref Range   Vitamin B-12 483 211 - 911 pg/mL  Lipid panel   Collection Time: 08/23/23  8:26 AM  Result Value Ref Range   Cholesterol 131 0 - 200 mg/dL   Triglycerides 10.9 0.0 - 149.0 mg/dL   HDL 54.29 >60.99 mg/dL   VLDL 82.1 0.0 - 59.9 mg/dL   LDL Cholesterol 67 0 - 99 mg/dL   Total CHOL/HDL Ratio 3    NonHDL 84.93   VITAMIN D  25 Hydroxy (Vit-D Deficiency, Fractures)   Collection Time: 08/23/23  8:26 AM  Result Value Ref Range   VITD 29.30 (L) 30.00 - 100.00 ng/mL  TSH   Collection Time: 08/23/23  8:26 AM  Result Value Ref Range   TSH 1.11 0.35 - 5.50 uIU/mL  Hemoglobin A1c   Collection Time: 08/23/23  8:26 AM  Result Value Ref Range   Hgb A1c MFr Bld 5.2 4.6 - 6.5 %  Comprehensive metabolic panel with GFR   Collection Time: 08/23/23  8:26 AM  Result Value Ref Range   Sodium 139 135 - 145 mEq/L   Potassium 4.4 3.5 - 5.1 mEq/L   Chloride 98 96 - 112 mEq/L   CO2 32 19 - 32 mEq/L   Glucose, Bld 79 70 - 99 mg/dL   BUN 9 6 - 23 mg/dL   Creatinine, Ser 9.09 0.40 - 1.50 mg/dL   Total Bilirubin 0.7 0.2 - 1.2 mg/dL   Alkaline Phosphatase 130 (H) 39 - 117 U/L   AST 20 0 - 37 U/L   ALT 18 0 - 53 U/L   Total Protein 7.2 6.0 - 8.3 g/dL   Albumin 4.7 3.5 - 5.2 g/dL   GFR 880.23 >39.99 mL/Mark   Calcium 9.6 8.4 - 10.5 mg/dL  CBC with Differential/Platelet   Collection Time: 08/23/23  8:26 AM  Result Value Ref Range   WBC 6.0 4.0 - 10.5 K/uL   RBC 5.24 4.22 - 5.81 Mil/uL   Hemoglobin 16.5 13.0 - 17.0 g/dL   HCT 52.5 60.9 - 47.9 %   MCV 90.4 78.0 - 100.0 fl   MCHC 34.8 30.0 - 36.0 g/dL   RDW 87.1 88.4 - 84.4 %   Platelets 181.0 150.0 - 400.0 K/uL   Neutrophils Relative % 52.0 43.0 - 77.0 %   Lymphocytes Relative 31.4 12.0 - 46.0 %   Monocytes Relative 11.9 3.0 - 12.0 %  Eosinophils Relative 3.6 0.0 - 5.0 %   Basophils Relative 1.1 0.0 - 3.0 %    Neutro Abs 3.1 1.4 - 7.7 K/uL   Lymphs Abs 1.9 0.7 - 4.0 K/uL   Monocytes Absolute 0.7 0.1 - 1.0 K/uL   Eosinophils Absolute 0.2 0.0 - 0.7 K/uL   Basophils Absolute 0.1 0.0 - 0.1 K/uL  HIV antibody (with reflex)   Collection Time: 08/23/23  8:26 AM  Result Value Ref Range   HIV FINAL INTERPRETATION     HIV 1&2 Ab, 4th Generation NON-REACTIVE NON-REACTIVE  Hepatitis C antibody   Collection Time: 08/23/23  8:26 AM  Result Value Ref Range   Hepatitis C Ab NON-REACTIVE NON-REACTIVE  Urine cytology ancillary only   Collection Time: 08/23/23  8:26 AM  Result Value Ref Range   Neisseria Gonorrhea Negative    Chlamydia Negative    Trichomonas Negative    Comment Normal Reference Ranger Chlamydia - Negative    Comment      Normal Reference Range Neisseria Gonorrhea - Negative   Comment Normal Reference Range Trichomonas - Negative     Assessment & Plan:   Problem List Items Addressed This Visit     Right groin pain - Primary   Story/exam most suspicious for R inguinal hernia - no obvious hernia but with mild laxity/fullness to right groin with cough. Discussed with patient as well as red flags to seek emergent care. Will refer for gen surgery evaluation.  Normal scrotal exam today without discomfort. Check UA today.       Relevant Orders   Ambulatory referral to General Surgery   Other Visit Diagnoses       RLQ abdominal pain       Relevant Orders   POCT Urinalysis Dipstick (Automated)        No orders of the defined types were placed in this encounter.   Orders Placed This Encounter  Procedures   Ambulatory referral to General Surgery    Referral Priority:   Routine    Referral Type:   Surgical    Referral Reason:   Specialty Services Required    Requested Specialty:   General Surgery    Number of Visits Requested:   1   POCT Urinalysis Dipstick (Automated)    Patient Instructions  Urine test today.  Story most suspicious for inguinal hernia - I will refer you  to surgeon for evaluation. If worsening pain, bulge that is not going back in, fever, vomiting, go to ER.   Follow up plan: No follow-ups on file.  Anton Blas, MD

## 2023-10-04 NOTE — Telephone Encounter (Signed)
 Noted. I appreciate Dr. Talmadge evaluation

## 2023-10-04 NOTE — Telephone Encounter (Signed)
 FYI Only or Action Required?: FYI only for provider.  Patient was last seen in primary care on 08/22/2023 by Wendee Lynwood HERO, NP.  Called Nurse Triage reporting Abdominal Pain.  Symptoms began about a month ago.  Interventions attempted: Nothing. Laying down  Symptoms are: gradually worsening.  Triage Disposition: See Physician Within 24 Hours  Patient/caregiver understands and will follow disposition?: yes                           Copied from CRM #[id number]. Topic: Clinical - Red Word Triage >> Oct 04, 2023  8:02 AM [place] A wrote: [facility] that prompted transfer to Nurse Triage: Patient is having pain in abdomin area he thinks near appendix. Pain has been on and off for about month. Reason for Disposition  [1] MODERATE pain (e.g., interferes with normal activities) AND [2] pain comes and goes (cramps) AND [3] present > 24 hours  (Exception: Pain with Vomiting or Diarrhea - see that Guideline.)  Answer Assessment - Initial Assessment Questions 1. LOCATION: Where does it hurt?      Lower right side 2. RADIATION: Does the pain shoot anywhere else? (e.g., chest, back)     no 3. ONSET: When did the pain begin? (Minutes, hours or days ago)      1 month 4. SUDDEN: Gradual or sudden onset?     gradual 5. PATTERN Does the pain come and go, or is it constant?     Comes and goes 6. SEVERITY: How bad is the pain?  (e.g., Scale 1-10; mild, moderate, or severe)     7-8/10 7. RECURRENT SYMPTOM: Have you ever had this type of stomach pain before? If Yes, ask: When was the last time? and What happened that time?      no 8. CAUSE: What do you think is causing the stomach pain? (e.g., gallstones, recent abdominal surgery)     Appendix 9. RELIEVING/AGGRAVATING FACTORS: What makes it better or worse? (e.g., antacids, bending or twisting motion, bowel movement)     Laying down 10. OTHER SYMPTOMS: Do you have any other symptoms? (e.g., back pain,  diarrhea, fever, urination pain, vomiting)       no  Protocols used: Abdominal Pain - Male-A-AH

## 2023-10-04 NOTE — Assessment & Plan Note (Signed)
 Story/exam most suspicious for R inguinal hernia - no obvious hernia but with mild laxity/fullness to right groin with cough. Discussed with patient as well as red flags to seek emergent care. Will refer for gen surgery evaluation.  Normal scrotal exam today without discomfort. Check UA today.

## 2023-10-04 NOTE — Patient Instructions (Addendum)
 Urine test today.  Story most suspicious for inguinal hernia - I will refer you to surgeon for evaluation. If worsening pain, bulge that is not going back in, fever, vomiting, go to ER.

## 2023-10-08 NOTE — Progress Notes (Deleted)
 Patient ID: Mark Butler, male   DOB: Dec 08, 2000, 23 y.o.   MRN: [id number]  Chief Complaint:  ***  History of Present Illness Mark Butler is a 23 y.o. male with ***.  Past Medical History No past medical history on file.    Past Surgical History:  Procedure Laterality Date   FOREIGN BODY REMOVAL EAR     KNEE ARTHROSCOPY WITH MENISCAL REPAIR Left 11/25/2014   Procedure: KNEE ARTHROSCOPY WITH MENISCAL REPAIR;  Surgeon: Norleen JINNY [name], MD;  Location: [facility];  Service: Orthopedics;  Laterality: Left;   PATELLAR TENDON REPAIR Left 11/25/2014   Procedure: PATELLA TENDON REPAIR/ PATELLOFEMORAL LIGAMENT;  Surgeon: Norleen JINNY [name], MD;  Location: [facility];  Service: Orthopedics;  Laterality: Left;    No Known Allergies  No current outpatient medications on file.   No current facility-administered medications for this visit.    Family History No family history on file.    Social History Social History   Tobacco Use   Smoking status: Never   Smokeless tobacco: Never   Tobacco comments:    Nicotine pouches   Vaping Use   Vaping status: Every Day   Substances: Nicotine, Flavoring   Devices: Nicotine  Substance Use Topics   Alcohol use: Yes    Alcohol/week: 4.0 - 5.0 standard drinks of alcohol    Types: 4 - 5 Standard drinks or equivalent per week    Comment: beer   Drug use: Not Currently    Types: Marijuana        ROS   Physical Exam There were no vitals taken for this visit.   CONSTITUTIONAL: Well developed, and nourished, appropriately responsive and aware without distress. ***  EYES: Sclera non-icteric.   EARS, NOSE, MOUTH AND THROAT: Mask worn.  *** The oropharynx is clear. Oral mucosa is pink and moist.  Dentition: ***   Hearing is intact to voice.  NECK: Trachea is midline, and there is no jugular venous distension.  LYMPH NODES:  Lymph nodes in the neck are not appreciated. RESPIRATORY:  Lungs are clear, and breath sounds are equal  bilaterally. *** Normal respiratory effort without pathologic use of accessory muscles. CARDIOVASCULAR: Heart is regular in rate and rhythm.  *** Well perfused.  GI: The abdomen is *** soft, nontender, and nondistended. There were no palpable masses. *** I did not appreciate hepatosplenomegaly. There were normal bowel sounds.  *** GU: *** MUSCULOSKELETAL:  Symmetrical muscle tone appreciated in all four extremities.    SKIN: Skin turgor is normal. No pathologic skin lesions appreciated.  NEUROLOGIC:  Motor and sensation appear grossly normal.  Cranial nerves are grossly without defect. PSYCH:  Alert and oriented to person, place and time. Affect is appropriate for situation.  Data Reviewed I have personally reviewed what is currently available of the patient's imaging, recent labs and medical records.   Labs:     Latest Ref Rng & Units 08/23/2023    8:26 AM 10/23/2018    1:56 PM 05/03/2018    5:34 PM  CBC  WBC 4.0 - 10.5 K/uL 6.0  6.6  11.0   Hemoglobin 13.0 - 17.0 g/dL 83.4  83.8  83.1   Hematocrit 39.0 - 52.0 % 47.4  47.3  46.9   Platelets 150.0 - 400.0 K/uL 181.0  176  200       Latest Ref Rng & Units 08/23/2023    8:26 AM 10/23/2018    1:56 PM 05/03/2018    5:34 PM  CMP  Glucose 70 - 99 mg/dL 79  93  95   BUN 6 - 23 mg/dL 9  9  10    Creatinine 0.40 - 1.50 mg/dL 9.09  9.20  9.19   Sodium 135 - 145 mEq/L 139  140  137   Potassium 3.5 - 5.1 mEq/L 4.4  3.7  3.5   Chloride 96 - 112 mEq/L 98  103  102   CO2 19 - 32 mEq/L 32  28  24   Calcium 8.4 - 10.5 mg/dL 9.6  9.5  9.7   Total Protein 6.0 - 8.3 g/dL 7.2   8.1   Total Bilirubin 0.2 - 1.2 mg/dL 0.7   1.1   Alkaline Phos 39 - 117 U/L 130   99   AST 0 - 37 U/L 20   21   ALT 0 - 53 U/L 18   12    *** {Labs :18171}  Imaging: Radiological images personally reviewed:  *** Within last 24 hrs: No results found.  Assessment    *** Patient Active Problem List   Diagnosis Date Noted   Right groin pain 10/04/2023   Vitamin D   deficiency 08/27/2023   Preventative health care 08/22/2023   Fatigue 08/22/2023   Non-restorative sleep 08/22/2023    Plan    ***  * Cannot find OR case * ***  I personally spent a total of *** minutes in the care of the patient today including {Time Based Coding:210964241}.   These notes generated with voice recognition software. I apologize for typographical errors.  Honor Leghorn M.D., FACS 10/08/2023, 3:45 PM

## 2023-10-10 ENCOUNTER — Ambulatory Visit: Payer: Self-pay | Admitting: Surgery

## 2023-10-17 ENCOUNTER — Encounter: Payer: Self-pay | Admitting: Surgery

## 2023-10-17 ENCOUNTER — Ambulatory Visit (INDEPENDENT_AMBULATORY_CARE_PROVIDER_SITE_OTHER): Payer: Self-pay | Admitting: Surgery

## 2023-10-17 VITALS — BP 112/76 | HR 66 | Temp 98.2°F | Ht 70.0 in | Wt 166.8 lb

## 2023-10-17 DIAGNOSIS — R1031 Right lower quadrant pain: Secondary | ICD-10-CM | POA: Diagnosis not present

## 2023-10-17 NOTE — Patient Instructions (Addendum)
 Your Ultrasound is scheduled for 10/21/2023 4:30 pm (arrive by 4 pm) at [facility].   You have chose to have your hernia repaired. This will be done by Dr. Lane at [facility].  If you are on any injectable weight loss medication, you will need to stop taking your GLP-1 injectable (weight loss) medications 8 days before your surgery to avoid any complications with anesthesia.   Please see your (blue) Pre-care information that you have been given today. Our surgery scheduler will call you to verify surgery date and to go over information.   You will need to arrange to be out of work for approximately 1-2 weeks and then you may return with a lifting restriction for 4 more weeks. If you have FMLA or Disability paperwork that needs to be filled out, please have your company fax your paperwork to [phone] or you may drop this by either office. This paperwork will be filled out within 3 days after your surgery has been completed.  You may have a bruise in your groin and also swelling and brusing in your testicle area. You may use ice 4-5 times daily for 15-20 minutes each time. Make sure that you place a barrier between you and the ice pack. To decrease the swelling, you may roll up a bath towel and place it vertically in between your thighs with your testicles resting on the towel. You will want to keep this area elevated as much as possible for several days following surgery.    Inguinal Hernia, Adult Muscles help keep everything in the body in its proper place. But if a weak spot in the muscles develops, something can poke through. That is called a hernia. When this happens in the lower part of the belly (abdomen), it is called an inguinal hernia. (It takes its name from a part of the body in this region called the inguinal canal.) A weak spot in the wall of muscles lets some fat or part of the small intestine bulge through. An inguinal hernia can develop at any age. Men get them more  often than women. CAUSES  In adults, an inguinal hernia develops over time. It can be triggered by: Suddenly straining the muscles of the lower abdomen. Lifting heavy objects. Straining to have a bowel movement. Difficult bowel movements (constipation) can lead to this. Constant coughing. This may be caused by smoking or lung disease. Being overweight. Being pregnant. Working at a job that requires long periods of standing or heavy lifting. Having had an inguinal hernia before. One type can be an emergency situation. It is called a strangulated inguinal hernia. It develops if part of the small intestine slips through the weak spot and cannot get back into the abdomen. The blood supply can be cut off. If that happens, part of the intestine may die. This situation requires emergency surgery. SYMPTOMS  Often, a small inguinal hernia has no symptoms. It is found when a healthcare provider does a physical exam. Larger hernias usually have symptoms.  In adults, symptoms may include: A lump in the groin. This is easier to see when the person is standing. It might disappear when lying down. In men, a lump in the scrotum. Pain or burning in the groin. This occurs especially when lifting, straining or coughing. A dull ache or feeling of pressure in the groin. Signs of a strangulated hernia can include: A bulge in the groin that becomes very painful and tender to the touch. A bulge that  turns red or purple. Fever, nausea and vomiting. Inability to have a bowel movement or to pass gas. DIAGNOSIS  To decide if you have an inguinal hernia, a healthcare provider will probably do a physical examination. This will include asking questions about any symptoms you have noticed. The healthcare provider might feel the groin area and ask you to cough. If an inguinal hernia is felt, the healthcare provider may try to slide it back into the abdomen. Usually no other tests are needed. TREATMENT  Treatments can  vary. The size of the hernia makes a difference. Options include: Watchful waiting. This is often suggested if the hernia is small and you have had no symptoms. No medical procedure will be done unless symptoms develop. You will need to watch closely for symptoms. If any occur, contact your healthcare provider right away. Surgery. This is used if the hernia is larger or you have symptoms. Open surgery. This is usually an outpatient procedure (you will not stay overnight in a hospital). An cut (incision) is made through the skin in the groin. The hernia is put back inside the abdomen. The weak area in the muscles is then repaired by herniorrhaphy or hernioplasty. Herniorrhaphy: in this type of surgery, the weak muscles are sewn back together. Hernioplasty: a patch or mesh is used to close the weak area in the abdominal wall. Laparoscopy. In this procedure, a surgeon makes small incisions. A thin tube with a tiny video camera (called a laparoscope) is put into the abdomen. The surgeon repairs the hernia with mesh by looking with the video camera and using two long instruments. HOME CARE INSTRUCTIONS  After surgery to repair an inguinal hernia: You will need to take pain medicine prescribed by your healthcare provider. Follow all directions carefully. You will need to take care of the wound from the incision. Your activity will be restricted for awhile. This will probably include no heavy lifting for several weeks. You also should not do anything too active for a few weeks. When you can return to work will depend on the type of job that you have. During watchful waiting periods, you should: Maintain a healthy weight. Eat a diet high in fiber (fruits, vegetables and whole grains). Drink plenty of fluids to avoid constipation. This means drinking enough water and other liquids to keep your urine clear or pale yellow. Do not lift heavy objects. Do not stand for long periods of time. Quit smoking. This  should keep you from developing a frequent cough. SEEK MEDICAL CARE IF:  A bulge develops in your groin area. You feel pain, a burning sensation or pressure in the groin. This might be worse if you are lifting or straining. You develop a fever of more than 100.5 F (38.1 C). SEEK IMMEDIATE MEDICAL CARE IF:  Pain in the groin increases suddenly. A bulge in the groin gets bigger suddenly and does not go down. For men, there is sudden pain in the scrotum. Or, the size of the scrotum increases. A bulge in the groin area becomes red or purple and is painful to touch. You have nausea or vomiting that does not go away. You feel your heart beating much faster than normal. You cannot have a bowel movement or pass gas. You develop a fever of more than 102.0 F (38.9 C).   This information is not intended to replace advice given to you by your health care provider. Make sure you discuss any questions you have with your health care provider.  Document Released: 05/20/2008 Document Revised: 03/26/2011 Document Reviewed: 07/05/2014 Elsevier Interactive Patient Education [employer]

## 2023-10-17 NOTE — Progress Notes (Signed)
 Patient ID: Mark Butler, male   DOB: October 19, 2000, 23 y.o.   MRN: [id number]  Chief Complaint: Right groin pain  History of Present Illness Mark Butler is a 23 y.o. male with an isolated episode of severe right groin pain with bulge approximately a month ago.  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  He reports the pain associated with it was quite severe.  He was able to lay down and seem to know how to reduce it and by applying pressure he was able to make the bulge dissipate and the pain improved.  He has had history leading up to this episode of some mild right lower quadrant pains.  Reports the time it was apparently incarcerated was about 10 minutes.  Past Medical History History reviewed. No pertinent past medical history.    Past Surgical History:  Procedure Laterality Date   FOREIGN BODY REMOVAL EAR     KNEE ARTHROSCOPY WITH MENISCAL REPAIR Left 11/25/2014   Procedure: KNEE ARTHROSCOPY WITH MENISCAL REPAIR;  Surgeon: Norleen JINNY [name], MD;  Location: [facility] ORS;  Service: Orthopedics;  Laterality: Left;   PATELLAR TENDON REPAIR Left 11/25/2014   Procedure: PATELLA TENDON REPAIR/ PATELLOFEMORAL LIGAMENT;  Surgeon: Norleen JINNY [name], MD;  Location: [facility] ORS;  Service: Orthopedics;  Laterality: Left;    No Known Allergies  No current outpatient medications on file.   No current facility-administered medications for this visit.    Family History History reviewed. No pertinent family history.    Social History Social History   Tobacco Use   Smoking status: Never   Smokeless tobacco: Never   Tobacco comments:    Nicotine pouches   Vaping Use   Vaping status: Every Day   Substances: Nicotine, Flavoring   Devices: Nicotine  Substance Use Topics   Alcohol use: Yes    Alcohol/week: 4.0 - 5.0 standard drinks of alcohol    Types:  4 - 5 Standard drinks or equivalent per week    Comment: beer   Drug use: Not Currently    Types: Marijuana        ROS   Physical Exam Blood pressure 112/76, pulse 66, temperature 98.2 F (36.8 C), temperature source Oral, height 5' 10 (1.778 m), weight 166 lb 12.8 oz (75.7 kg), SpO2 98%. Last Weight  Most recent update: 10/17/2023  1:41 PM    Weight  75.7 kg (166 lb 12.8 oz)             CONSTITUTIONAL: Well developed, and nourished, appropriately responsive and aware without distress.   EYES: Sclera non-icteric.   EARS, NOSE, MOUTH AND THROAT:  The oropharynx is clear. Oral mucosa is pink and moist.    Hearing is intact to voice.  NECK: Trachea is midline, and there is no jugular venous distension.  LYMPH NODES:  Lymph nodes in the neck are not appreciated. RESPIRATORY:  Lungs are clear, and breath sounds are equal bilaterally.  Normal respiratory effort without pathologic use of accessory muscles. CARDIOVASCULAR: Heart is regular in rate and rhythm.   Well perfused.  GI: The abdomen is  soft, nontender, and nondistended. There were no palpable masses.  I did not appreciate hepatosplenomegaly. GU: Wider external right inguinal ring with more prominence of proximal contents, with move/shifting on Valsalva without appreciable palpable hernia sac on exam.  Less so  on the left, no prominence of cord, clearly no hernia sac on the left either.  Possible early or high riding right inguinal hernia. MUSCULOSKELETAL:  Symmetrical muscle tone appreciated in all four extremities.    SKIN: Skin turgor is normal. No pathologic skin lesions appreciated.  NEUROLOGIC:  Motor and sensation appear grossly normal.  Cranial nerves are grossly without defect. PSYCH:  Alert and oriented to person, place and time. Affect is appropriate for situation.  Data Reviewed I have personally reviewed what is currently available of the patient's imaging, recent labs and medical records.   Labs:     Latest  Ref Rng & Units 08/23/2023    8:26 AM 10/23/2018    1:56 PM 05/03/2018    5:34 PM  CBC  WBC 4.0 - 10.5 K/uL 6.0  6.6  11.0   Hemoglobin 13.0 - 17.0 g/dL 83.4  83.8  83.1   Hematocrit 39.0 - 52.0 % 47.4  47.3  46.9   Platelets 150.0 - 400.0 K/uL 181.0  176  200       Latest Ref Rng & Units 08/23/2023    8:26 AM 10/23/2018    1:56 PM 05/03/2018    5:34 PM  CMP  Glucose 70 - 99 mg/dL 79  93  95   BUN 6 - 23 mg/dL 9  9  10    Creatinine 0.40 - 1.50 mg/dL 9.09  9.20  9.19   Sodium 135 - 145 mEq/L 139  140  137   Potassium 3.5 - 5.1 mEq/L 4.4  3.7  3.5   Chloride 96 - 112 mEq/L 98  103  102   CO2 19 - 32 mEq/L 32  28  24   Calcium 8.4 - 10.5 mg/dL 9.6  9.5  9.7   Total Protein 6.0 - 8.3 g/dL 7.2   8.1   Total Bilirubin 0.2 - 1.2 mg/dL 0.7   1.1   Alkaline Phos 39 - 117 U/L 130   99   AST 0 - 37 U/L 20   21   ALT 0 - 53 U/L 18   12       Imaging: Radiological images personally reviewed:   Within last 24 hrs: No results found.  Assessment    Possible right inguinal hernia. Patient Active Problem List   Diagnosis Date Noted   Right groin pain 10/04/2023   Vitamin D  deficiency 08/27/2023   Preventative health care 08/22/2023   Fatigue 08/22/2023   Non-restorative sleep 08/22/2023    Plan    Options given based upon his history to proceed with operative evaluation, or to obtain an ultrasound scan first.  He has opted to obtain the scan.   I reviewed with him the techniques, and the way we performed robotic inguinal hernia repair.  I reviewed the risks with him as noted below. I discussed possibility of incarceration, strangulation, enlargement in size over time, and the need for emergency surgery in the face of these.  Also reviewed the techniques of reduction should incarceration occur, and when unsuccessful to present to the ED.  Also discussed that surgery risks include recurrence which can be up to 30% in the case of complex hernias, use of prosthetic materials (mesh) and the  increased risk of infection and the possible need for re-operation and removal of mesh, possibility of post-op SBO or ileus, and the risks of general anesthetic including heart attack, stroke, sudden death or some reaction to anesthetic medications. The patient, and those present, appear to  understand the risks, any and all questions were answered to the patient's satisfaction.  No guarantees were ever expressed or implied.     I personally spent a total of 45 minutes in the care of the patient today including preparing to see the patient, getting/reviewing separately obtained history, performing a medically appropriate exam/evaluation, counseling and educating, and documenting clinical information in the EHR.   These notes generated with voice recognition software. I apologize for typographical errors.  Honor Leghorn M.D., FACS 10/17/2023, 2:05 PM

## 2023-10-21 ENCOUNTER — Ambulatory Visit
Admission: RE | Admit: 2023-10-21 | Discharge: 2023-10-21 | Disposition: A | Discharge status: Home / Self Care | Source: Ambulatory Visit | Attending: Surgery | Admitting: Surgery

## 2023-10-21 DIAGNOSIS — R1031 Right lower quadrant pain: Secondary | ICD-10-CM | POA: Insufficient documentation

## 2023-12-23 ENCOUNTER — Ambulatory Visit: Admitting: Family Medicine

## 2023-12-23 ENCOUNTER — Telehealth: Payer: Self-pay | Admitting: Family Medicine

## 2023-12-23 ENCOUNTER — Ambulatory Visit: Payer: Self-pay | Admitting: *Deleted

## 2023-12-23 NOTE — Telephone Encounter (Signed)
 Disconnected during transfer. Placed in CB d/t transfer of a new call from supervisor prior to call back attempt.  Copied from CRM [id number]. Topic: Clinical - Red Word Triage >> Dec 23, 2023  9:05 AM Jasmin G wrote: [facility] that prompted transfer to Nurse Triage: Pt states that he has been putting off an appt for a hernia that has gotten worse, pt is experiencing a lot of pain.

## 2023-12-23 NOTE — Telephone Encounter (Signed)
 Please call Dr. Ane surgery clinic.  My note is not yet complete but patient is more symptomatic from his hernia.  Please have surgery clinic call patient for consideration/scheduling surgery.  Thanks.

## 2023-12-23 NOTE — Telephone Encounter (Signed)
 Noted. Thanks.

## 2023-12-23 NOTE — Telephone Encounter (Signed)
 Opened in error

## 2023-12-23 NOTE — Progress Notes (Unsigned)
 Prev u/s with fat containing inguinal hernia on the right.  More pain over there last month.  R side of scrotum is larger now.  He is still working.  No FCNAVD.  More pain lifting.    Meds, vitals, and allergies reviewed.   ROS: Per HPI unless specifically indicated in ROS section

## 2023-12-23 NOTE — Patient Instructions (Addendum)
 Limit lifting in the meantime.  If severe pain, then go to the ER.   Please call about follow up at [facility] [phone]  Take care.  Glad to see you.

## 2023-12-23 NOTE — Telephone Encounter (Signed)
 noted

## 2023-12-23 NOTE — Telephone Encounter (Signed)
 Duplicate, see nurse triage encounter completed today by RN Slater. Closing this encounter. This encounter was created in error - please disregard.

## 2023-12-23 NOTE — Telephone Encounter (Signed)
 FYI Only or Action Required?: FYI only for provider: appointment scheduled on 12/23/23.  Patient was last seen in primary care on 10/04/2023 by Rilla Baller, MD.  Called Nurse Triage reporting [place].  Symptoms began about a month ago.  Interventions attempted: Other: Patient has seen surgeon and had scan.  Symptoms are: gradually worsening.  Triage Disposition: See HCP Within 4 Hours (Or PCP Triage)  Patient/caregiver understands and will follow disposition?: Yes  Copied from CRM [id number]. Topic: Clinical - Red Word Triage >> Dec 23, 2023  9:05 AM Jasmin G wrote: [facility] that prompted transfer to Nurse Triage: Pt states that he has been putting off an appt for a hernia that has gotten worse, pt is experiencing a lot of pain. >> Dec 23, 2023  9:23 AM Gustabo D wrote: Pt was disconnected . Reason for Disposition  [1] Swelling of groin (inguinal area) AND [2] painful  Answer Assessment - Initial Assessment Questions 1. APPEARANCE of SWELLING: What does it look like?     R side- bulging R lower hip- testicle swelling 2. SIZE: How large is the swelling? (e.g., inches, cm; or compare to size of pinhead, tip of pen, eraser, coin, pea, grape, ping pong ball)      Testicle tangerine size 3. LOCATION: Where is the swelling located?     See above 4. ONSET: When did the swelling start?     1 month ago- last throughout the day - resting it decreased 5. COLOR: What color is it? Is there more than one color?     No discoloration 6. PAIN: Is there any pain? If Yes, ask: How bad is the pain? (Scale 1-10; or mild, moderate, severe)       7/10  8. CAUSE: What do you think caused the swelling?     Thought hernia- but not diagnosed on scan 9 OTHER SYMPTOMS: Do you have any other symptoms? (e.g., fever)     no  Protocols used: Skin Lump or Localized Swelling-A-AH

## 2023-12-23 NOTE — Telephone Encounter (Signed)
 Called Dr. Ane office and spoke with Jon. She is going to reach out to the pt to get him scheduled.

## 2023-12-25 NOTE — Assessment & Plan Note (Signed)
 Hernia noted, imaging documented.  Discussed anatomy and rationale for follow-up with surgery.   Advised to limit lifting in the meantime.  If severe pain, then go to the ER.  I asked him to please call about follow up at [facility] at [facility] [phone].  I sent a note to the surgery clinic in the meantime.  I appreciate the help of all involved.

## 2024-01-02 ENCOUNTER — Telehealth: Payer: Self-pay | Admitting: General Surgery

## 2024-01-02 ENCOUNTER — Ambulatory Visit: Payer: Self-pay | Admitting: General Surgery

## 2024-01-02 ENCOUNTER — Ambulatory Visit (INDEPENDENT_AMBULATORY_CARE_PROVIDER_SITE_OTHER): Admitting: General Surgery

## 2024-01-02 VITALS — BP 116/76 | HR 72 | Temp 98.3°F | Ht 70.0 in | Wt 182.4 lb

## 2024-01-02 DIAGNOSIS — K4090 Unilateral inguinal hernia, without obstruction or gangrene, not specified as recurrent: Secondary | ICD-10-CM

## 2024-01-02 DIAGNOSIS — R1031 Right lower quadrant pain: Secondary | ICD-10-CM

## 2024-01-02 NOTE — Telephone Encounter (Signed)
 Patient has been advised of Pre-Admission date/time, and Surgery date at [facility].  Surgery Date: 01/06/24 Preadmission Testing Date: 01/03/24 (phone 1p-4p)  Patient informed of the scheduling process and surgery information given at time of office visit.  Patient has been made aware to call [phone], between 1-3:00pm the day before surgery, to find out what time to arrive for surgery.

## 2024-01-02 NOTE — Patient Instructions (Signed)
 You have requested for your Umbilical Hernia be repaired. This will be scheduled with Dr. Marinda at [facility].   If you are on any injectable weight loss medication, you will need to stop taking your GLP-1 injectable (weight loss) medications 8 days before your surgery to avoid any complications with anesthesia.   Please see your (blue)pre-care sheet for information. Our surgery scheduler will call you to verify surgery date and to go over information.   You will need to arrange to be off work for 1-2 weeks but will have to have a lifting restriction of no more than 15 lbs for 6 weeks following your surgery. If you have FMLA or disability paperwork that needs filled out you may drop this off at our office or this can be faxed to [phone].   Groin Hernia (Inguinal Hernia) in Adults: What to Know  A hernia happens when an organ or tissue in your body pushes out through a weak spot in the muscles of your belly. This makes a bulge. A groin hernia is also called an inguinal hernia. It's found in your groin, which is the area where your leg meets your lower belly. This kind of hernia could also be: In your scrotum, if you're male. In the folds of skin around your vagina, if you're male. You may be able to push the bulge back into your belly. If you can't push it in and blood flow is cut off to the hernia, you'll need surgery right away. What are the causes? A groin hernia may happen when you strain your belly muscles, such as when you: Lift a heavy object. Strain to poop. Cough. What increases the risk? You may be more likely to get a groin hernia if: You're male. You're 50 years or older. You're pregnant. You've had a groin hernia or belly surgery before. You smoke. You're overweight. You work at a job where you need to stand a lot or lift heavy things. What are the signs or symptoms? Symptoms may depend on how big the hernia is. If it's small, you may not  have symptoms. If it's bigger, you may have: A bulge near your groin or genitals. Pain or burning in your groin. A dull ache or feeling of pressure in your groin. If blood flow is cut off to the tissues inside the hernia, you may also: Feel pain and tenderness when you touch the bulge. The skin over it may turn red or purple. Have a fever. Throw up or feel like you may throw up. Have trouble pooping or passing gas. How is this treated? Treatment depends on how big the hernia is and what symptoms you have. You may need: To be watched to see if the bulge grows bigger. Surgery. This may be done if the hernia is big or if you have symptoms. Follow these instructions at home: Lifestyle Ask if it's OK for you to lift. Try not to stand for long periods of time. Do not smoke, vape, or use nicotine or tobacco. Stay at a healthy weight. Try not to do things that put pressure on your hernia. Preventing trouble pooping You may need to take these steps to help prevent or treat trouble pooping (constipation): Take medicines to help you poop. Eat foods high in fiber, like beans, whole grains, and fresh fruits and vegetables. Drink more fluids as told. General instructions Try to push the hernia back in place by very gently pressing on it while lying down. Do  not try to force it back in if it won't push in easily. Watch your hernia for any changes in: Shape. Size. Color. Take your medicines only as told. Contact a doctor if: You have a fever. You have new symptoms. Your symptoms get worse. You can't poop or pass gas. Get help right away if: Your bulge: Starts to hurt a lot. Changes color. You have sudden pain in your scrotum, or your scrotum changes size. You can't gently push the hernia back in place. You feel like you may vomit, and that feeling does not go away. You keep throwing up or feeling like you need to throw up. These symptoms may be an emergency. Call 911 right away. Do not  wait to see if the symptoms will go away. Do not drive yourself to the hospital. This information is not intended to replace advice given to you by your health care provider. Make sure you discuss any questions you have with your health care provider. Document Revised: 08/30/2022 Document Reviewed: 08/30/2022 Elsevier Patient Education  2024 [employer].

## 2024-01-03 ENCOUNTER — Encounter
Admission: RE | Admit: 2024-01-03 | Discharge: 2024-01-03 | Disposition: A | Discharge status: Home / Self Care | Source: Ambulatory Visit | Attending: General Surgery | Admitting: General Surgery

## 2024-01-03 ENCOUNTER — Other Ambulatory Visit: Payer: Self-pay

## 2024-01-03 NOTE — Progress Notes (Addendum)
 " Perioperative Services Pre-Admission/Anesthesia Testing    Date: 01/03/2024  Name: Mark Butler DOB: November 11, 2000 MRN:   [id number]  Re: plans for surgery; cancellation due to illness  Planned Surgical Procedure(s):     Case: [id number] Date/Time: 01/06/24 9078   Procedure: HERNIORRHAPHY, INGUINAL, ROBOT-ASSISTED, LAPAROSCOPIC (Right) - repair with mesh, possible bilateral repair   Anesthesia type: General   Diagnosis: Right inguinal hernia [K40.90]   Pre-op diagnosis: Right inguinal hernia, possible bilateral   Location: [facility] OR ROOM 04 / [facility]   Surgeons: Marinda Jayson KIDD, MD        Clinical Notes:  Patient is scheduled for the above procedure on 01/06/2024 with Dr. Jayson Marinda, MD.  During PAT interview today (01/03/2024) we learned that patient has an acute upper respiratory tract infection.  Patient with cough and congestion; (+) green mucopurulent drainage.  Patient has not had any fevers.  Interviewing RN advised that patient without any known sick contacts.  He was advised at the time of his phone call to be tested for SARS-CoV-2.   In the setting of acute illness, patient was discussed with anesthesia on-call Alm MD).  Due to acute symptoms, anesthesia asking the patient's case be postponed until after patient is well.  Patient will need to be symptom-free for 2 weeks prior to case being rescheduled.  Information was communicated to primary attending surgeon.  Surgeon with questions regarding feasibility of case proceeding as planned.  Concerns/questions were ultimately fielded by anesthesia attending. Recommendations for postponing case remain as previously stated.  Impression and Plan:  Patient scheduled for surgery on Monday, 01/06/2024 with Dr. Marinda.  Patient currently with acute respiratory illness. Again, case will need to be postponed until patient is well for 2 weeks.  Interviewing RN was to call patient to make them aware,  however I learned at the end of the day that patient had not been notified.    Attempted to call numbers on file, the [phone] is no longer in service. No answer on cell phone number, however I did leave a detailed message left on patient's voicemail explaining plans for postponing surgery. He was instructed to follow up with office once he is well to discuss rescheduling procedure.   MyChart message sent to patient in order to ensure that information was communicated and that there was notice of receipt on record, as case is still posted to the OR board. Will follow up to ensure that patient reads that message and attempt to contact him again should it not be viewed over the weekend.   OR staff notified of cancellation for Monday. Sillmon, RN to remove case from active OR board for 01/06/2024. No further needs from the PAT department identified at this time.  ADDENDUM 01/03/2024 at 2135 PM: Patient has viewed message and sent a reply. I did my best to respond to patient's concerns via MyChart. He is frustrated about his DOS having to be postponed citing chronicity of issue and the fact that it is now imposing occupational limitations. It was explained to patient that decision was not made in haste. I explained that the anesthesia team held his best interest at the forefront of their decision. Again, he was directed to contact the office on Monday to discuss having case rescheduled once well.   Mark Pereyra, MSN, APRN, FNP-C, CEN [facility]  Perioperative Services Nurse Practitioner Phone: [phone] Fax: [phone] 01/03/2024 18:00 PM  NOTE: This note has been prepared using  Scientist, clinical (histocompatibility and immunogenetics). Despite my best ability to proofread, there is always the potential that unintentional transcriptional errors may still occur from this process.  "

## 2024-01-03 NOTE — Patient Instructions (Addendum)
 Your procedure is scheduled on: Monday 01/06/24  To find out your arrival time, please call [phone] between 1PM - 3PM on: Friday 01/03/24 Report to the Registration Desk on the 1st floor of the [facility]. If your arrival time is 6:00 am, do not arrive before that time as the [facility] entrance doors do not open until 6:00 am.  REMEMBER: Instructions that are not followed completely may result in serious medical risk, up to and including death; or upon the discretion of your surgeon and anesthesiologist your surgery may need to be rescheduled.  Do not eat food or drink any liquids after midnight the night before surgery.  No gum chewing or hard candies.  One week prior to surgery: Stop Anti-inflammatories (NSAIDS) such as Advil , Aleve, Ibuprofen , Motrin , Naproxen, Naprosyn and Aspirin based products such as Excedrin, Goody's Powder, BC Powder.  You may however, continue to take Tylenol  if needed for pain up until the day of surgery.  Stop ANY OVER THE COUNTER supplements and vitamins for at least 7 days until after surgery.  Continue taking all of your other prescription medications up until the day of surgery.  ON THE DAY OF SURGERY ONLY TAKE THESE MEDICATIONS WITH SIPS OF WATER:  none  No Alcohol for 24 hours before or after surgery.  No Smoking including e-cigarettes for 24 hours before surgery.  No chewable tobacco products for at least 6 hours before surgery.  No nicotine patches on the day of surgery.  Do not use any recreational drugs for at least a week (preferably 2 weeks) before your surgery.  Please be advised that the combination of cocaine and anesthesia may have negative outcomes, up to and including death. If you test positive for cocaine, your surgery will be cancelled.  On the morning of surgery brush your teeth with toothpaste and water, you may rinse your mouth with mouthwash if you wish. Do not swallow any toothpaste or mouthwash.  Use CHG Soap  or wipes as directed on instruction sheet. (You can pick this up at our office in the [facility], the building to the left of the [employer], [address] at [address]  Do not shave body hair from the neck down 48 hours before surgery.  Do not wear lotions, powders, or perfumes.   Wear comfortable clothing (specific to your surgery type) to the hospital.  Do not wear jewelry, make-up, hairpins, clips or nail polish.  For welded (permanent) jewelry: bracelets, anklets, waist bands, etc.  Please have this removed prior to surgery.  If it is not removed, there is a chance that hospital personnel will need to cut it off on the day of surgery.  Contact lenses, hearing aids and dentures may not be worn into surgery.  Do not bring valuables to the hospital. [facility] is not responsible for any missing/lost belongings or valuables.   Notify your doctor if there is any change in your medical condition (cold, fever, infection).  After surgery, you can help prevent lung complications by doing breathing exercises.  Take deep breaths and cough every 1-2 hours. Your doctor may order a device called an Incentive Spirometer to help you take deep breaths.  When coughing or sneezing, hold a pillow firmly against your incision with both hands. This is called splinting. Doing this helps protect your incision. It also decreases belly discomfort.  If you are being discharged the day of surgery, you will not be allowed to drive home.  You will need a responsible individual to drive you home and stay with you for 24 hours after surgery.   Please call the Pre-admissions Testing Dept. at [phone] if you have any questions about these instructions.  Surgery Visitation Policy:  Patients having surgery or a procedure may have two visitors.  Children under the age of 41 must have an adult with them who is not the patient.   Merchandiser, Retail to address  health-related social needs:  [email or web address]                                                                                                             Preparing for Surgery with CHLORHEXIDINE  GLUCONATE (CHG) Soap  Chlorhexidine  Gluconate (CHG) Soap  o An antiseptic cleaner that kills germs and bonds with the skin to continue killing germs even after washing  o Used for showering the night before surgery and morning of surgery  Before surgery, you can play an important role by reducing the number of germs on your skin.  CHG (Chlorhexidine  gluconate) soap is an antiseptic cleanser which kills germs and bonds with the skin to continue killing germs even after washing.  Please do not use if you have an allergy to CHG or antibacterial soaps. If your skin becomes reddened/irritated stop using the CHG.  1. Shower the NIGHT BEFORE SURGERY with CHG soap.  2. If you choose to wash your hair, wash your hair first as usual with your normal shampoo.  3. After shampooing, rinse your hair and body thoroughly to remove the shampoo.  4. Use CHG as you would any other liquid soap. You can apply CHG directly to the skin and wash gently with a clean washcloth.  5. Apply the CHG soap to your body only from the neck down. Do not use on open wounds or open sores. Avoid contact with your eyes, ears, mouth, and genitals (private parts). Wash face and genitals (private parts) with your normal soap.  6. Wash thoroughly, paying special attention to the area where your surgery will be performed.  7. Thoroughly rinse your body with warm water.  8. Do not shower/wash with your normal soap after using and rinsing off the CHG soap.  9. Do not use lotions, oils, etc., after showering with CHG.  10. Pat yourself dry with a clean towel.  11. Wear clean pajamas to bed the night before surgery.  12. Place clean sheets on your bed the night of your shower and do not sleep with pets.  13. Do  not apply any deodorants/lotions/powders.  14. Please wear clean clothes to the hospital.  15. Remember to brush your teeth with your regular toothpaste.

## 2024-01-03 NOTE — Pre-Procedure Instructions (Signed)
 Pre op phone call to pt completed. Patient noted he had clr to greenish nasal discharge and nonproductive cough, no fever. He was instructed to call surgeon's office and test himself for Covid. Msg sent to B Gaile at surgeon's office.

## 2024-01-05 ENCOUNTER — Encounter: Payer: Self-pay | Admitting: General Surgery

## 2024-01-06 ENCOUNTER — Encounter: Admission: RE | Payer: Self-pay | Source: Home / Self Care

## 2024-01-06 ENCOUNTER — Encounter: Payer: Self-pay | Admitting: Family Medicine

## 2024-01-06 ENCOUNTER — Telehealth: Payer: Self-pay | Admitting: Nurse Practitioner

## 2024-01-06 ENCOUNTER — Encounter: Payer: Self-pay | Admitting: Urgent Care

## 2024-01-06 ENCOUNTER — Ambulatory Visit: Admission: RE | Admit: 2024-01-06 | Source: Home / Self Care | Admitting: General Surgery

## 2024-01-06 SURGERY — HERNIORRHAPHY, INGUINAL, ROBOT-ASSISTED, LAPAROSCOPIC
Anesthesia: General | Laterality: Right

## 2024-01-06 NOTE — Telephone Encounter (Signed)
 Patient emailed in form to be completed by provider placed in providers box at front desk

## 2024-01-07 ENCOUNTER — Telehealth: Payer: Self-pay | Admitting: General Surgery

## 2024-01-07 NOTE — Telephone Encounter (Signed)
 Patient has been advised of Pre-Admission date/time, and Surgery date at [facility]  Surgery Date: 01/27/24 Preadmission Testing Date: Preadmissions to call patient  Patient has been informed of his rescheduled surgery and information given.    Patient has been made aware to call [phone], between 1-3:00pm the day before surgery, to find out what time to arrive for surgery.

## 2024-01-07 NOTE — Progress Notes (Signed)
 Patient ID: Mark Butler, male   DOB: 04/03/2000, 23 y.o.   MRN: [id number] CC: Right Inguinal Hernia History of Present Illness Mark Butler is a 23 y.o. male with no significant medical history that presents to consultation for right inguinal hernia.  The patient reports that he has had pain over the last month in his right groin.  He also says that his right scrotum is bigger now.  He reports that lifting exacerbates the pain.  He denies any overlying skin changes and denies any obstipation symptoms..  Past Medical History Past Medical History:  Diagnosis Date   Right inguinal hernia        Past Surgical History:  Procedure Laterality Date   FOREIGN BODY REMOVAL EAR     KNEE ARTHROSCOPY WITH MENISCAL REPAIR Left 11/25/2014   Procedure: KNEE ARTHROSCOPY WITH MENISCAL REPAIR;  Surgeon: Norleen JINNY [name], MD;  Location: [facility] ORS;  Service: Orthopedics;  Laterality: Left;   PATELLAR TENDON REPAIR Left 11/25/2014   Procedure: PATELLA TENDON REPAIR/ PATELLOFEMORAL LIGAMENT;  Surgeon: Norleen JINNY [name], MD;  Location: [facility] ORS;  Service: Orthopedics;  Laterality: Left;    Allergies[1]  No current outpatient medications on file.   No current facility-administered medications for this visit.    Family History History reviewed. No pertinent family history.     Social History Social History[2]      ROS Full ROS of systems performed and is otherwise negative there than what is stated in the HPI  Physical Exam Blood pressure 116/76, pulse 72, temperature 98.3 F (36.8 C), temperature source Oral, height 5' 10 (1.778 m), weight 182 lb 6.4 oz (82.7 kg), SpO2 98%.  Alert and oriented x 3, normal work of breathing on room air, regular rate and rhythm, abdomen soft, nontender nondistended, right groin with obvious inguinal hernia that is reducible.  No overlying skin changes but there is some pain with reduction  Data Reviewed Ultrasound independently reviewed and he does have  a fat-containing right inguinal hernia.  I have personally reviewed the patient's imaging and medical records.    Assessment    Patient with right inguinal hernia  Plan    Plan for robotic assisted right inguinal hernia possible left inguinal hernia repair.  I discussed the risk, benefits alternatives of the procedure including risk of infection, bleeding, damage to the vas deferens, testicular ischemia, chronic pain and hernia recurrence.  He understands his risk and wishes to proceed with surgery    [name]      [1] No Known Allergies [2]  Social History Tobacco Use   Smoking status: Never   Smokeless tobacco: Current   Tobacco comments:    Nicotine pouches   Vaping Use   Vaping status: Every Day   Substances: Nicotine, Flavoring   Devices: Nicotine  Substance Use Topics   Alcohol use: Yes    Alcohol/week: 4.0 - 5.0 standard drinks of alcohol    Types: 4 - 5 Standard drinks or equivalent per week    Comment: beer   Drug use: Not Currently    Types: Marijuana

## 2024-01-07 NOTE — Telephone Encounter (Signed)
 Placed in signature folder.

## 2024-01-07 NOTE — Telephone Encounter (Signed)
 Called patient surgery was canceled because he has congestion and cold symptoms. He has to be symptoms free for 2 weeks before he can reschedule. Patient would like form filled out so that he can go back to work until then. Advised patient that provider that last seen him will not be in office until after 1/5. Let him know that I am sending to pcp for review but office visit may be needed to complete.

## 2024-01-10 NOTE — Telephone Encounter (Signed)
 He can either wait for Dr. Cleatus to return or have an office visit with me to evaluate him

## 2024-01-14 NOTE — Telephone Encounter (Unsigned)
 Copied from CRM [id number]. Topic: General - Other >> Jan 14, 2024  2:34 PM China J wrote: Reason for CRM: The patient's employer is needing him to get the forms he emailed, turned in as soon as possible and he is wondering if there is a different doctor other than Dr. Cleatus that [place] recommends he sees in order to get these filled out sooner.  Please call [phone] for update.

## 2024-01-17 ENCOUNTER — Ambulatory Visit: Payer: Self-pay | Admitting: *Deleted

## 2024-01-17 NOTE — Telephone Encounter (Signed)
 Duplicate message see phone note for further documentation.

## 2024-01-17 NOTE — Telephone Encounter (Signed)
 Left message to return call to our office.  Please provide  message from provider/office when call is returned from patient.

## 2024-01-17 NOTE — Telephone Encounter (Signed)
 FYI Only or Action Required?: Action required by provider: request for appointment and requesting if appt can be VV or needs to be in person .  Patient was last seen in primary care on 12/23/2023 by Cleatus Arlyss RAMAN, MD.  Called Nurse Triage reporting Advice Only.  Symptoms began na .  Interventions attempted: Other: sent email of return to work form .  Symptoms are: stable.  Triage Disposition: Call PCP When Office is Open  Patient/caregiver understands and will follow disposition?: No, wishes to speak with PCP    Please advise regarding appt for return to work form. See previous requests. Patient requesting call back.        Copied from CRM [id number]. Topic: Clinical - Red Word Triage >> Jan 17, 2024 12:57 PM Jayma L wrote: Red Word that prompted transfer to Nurse Triage:  pain getting worse , in groin and hard to sleep at night Reason for Disposition  [1] Caller requesting NON-URGENT health information AND [2] PCP's office is the best resource  Answer Assessment - Initial Assessment Questions 1. REASON FOR CALL: What is the main reason for your call? or How can I best help you?     Requesting to schedule appt for return to work form. NP message states patient can do appt  2. SYMPTOMS : Do you have any symptoms?      Awaiting surgery for groin pain /sx 3. OTHER QUESTIONS: Do you have any other questions?     Can appt be VV or needs to be in person? Requesting appt Monday for return to work until surgery 01/27/24.  Protocols used: Information Only Call - No Triage-A-AH

## 2024-01-21 NOTE — Telephone Encounter (Signed)
 Pt called again and states he needs the paperwork filled out asap. His supervisor needs the paperwork before the pt can go back to work. I let pt know Dr Cleatus was out for a week and the paperwork will be filled as soon as he can.

## 2024-01-22 NOTE — Telephone Encounter (Unsigned)
 Copied from CRM [id number]. Topic: General - Other >> Jan 21, 2024  2:55 PM Carlyon D wrote: Reason for CRM: Pt is calling in regards to some paper work for him to go back to work that needs to be filled out by Dr. Cleatus called [employer] he is back today after being out for a week [employer] advised me once the paper work is completed they will call pt >> Jan 22, 2024  1:24 PM Roselie C wrote: Pt is calling in regards to some paper work for him to go back to work that needs to be filled out, Please give the patient a call concerning this as soon as possible.

## 2024-01-22 NOTE — Telephone Encounter (Signed)
 I sent a telephone encounter on 01/06/2024 that stated he could wait for Dr. Cleatus or schedule an appointment with me. Dr. Cleatus is back on office.

## 2024-01-26 ENCOUNTER — Telehealth: Payer: Self-pay | Admitting: Family Medicine

## 2024-01-26 NOTE — Telephone Encounter (Signed)
 Please pull his paperwork for me so I can try to work on it first thing Monday morning.  Thanks.

## 2024-01-27 ENCOUNTER — Ambulatory Visit
Admission: RE | Admit: 2024-01-27 | Discharge: 2024-01-27 | Disposition: A | Discharge status: Home / Self Care | Attending: General Surgery | Admitting: General Surgery

## 2024-01-27 ENCOUNTER — Other Ambulatory Visit: Payer: Self-pay

## 2024-01-27 ENCOUNTER — Encounter
Admission: RE | Disposition: A | Discharge status: Home / Self Care | Payer: Self-pay | Source: Home / Self Care | Attending: General Surgery

## 2024-01-27 ENCOUNTER — Ambulatory Visit

## 2024-01-27 ENCOUNTER — Encounter: Payer: Self-pay | Admitting: General Surgery

## 2024-01-27 DIAGNOSIS — K4090 Unilateral inguinal hernia, without obstruction or gangrene, not specified as recurrent: Secondary | ICD-10-CM | POA: Insufficient documentation

## 2024-01-27 DIAGNOSIS — F17290 Nicotine dependence, other tobacco product, uncomplicated: Secondary | ICD-10-CM | POA: Insufficient documentation

## 2024-01-27 HISTORY — PX: INSERTION OF MESH: SHX5868

## 2024-01-27 HISTORY — PX: HERNIORRHAPHY, INGUINAL, ROBOT-ASSISTED, LAPAROSCOPIC: SHX7585

## 2024-01-27 SURGERY — HERNIORRHAPHY, INGUINAL, ROBOT-ASSISTED, LAPAROSCOPIC
Anesthesia: General | Site: Groin | Laterality: Right

## 2024-01-27 MED ORDER — PROPOFOL 10 MG/ML IV BOLUS
INTRAVENOUS | Status: DC | PRN
  Administered 2024-01-27: 150 mg via INTRAVENOUS
  Administered 2024-01-27: 50 mg via INTRAVENOUS

## 2024-01-27 MED ORDER — CHLORHEXIDINE GLUCONATE 0.12 % MT SOLN
OROMUCOSAL | Status: AC
  Filled 2024-01-27: qty 15

## 2024-01-27 MED ORDER — GLYCOPYRROLATE 0.2 MG/ML IJ SOLN
INTRAMUSCULAR | Status: DC | PRN
  Administered 2024-01-27: .2 mg via INTRAVENOUS

## 2024-01-27 MED ORDER — HYDROMORPHONE HCL 1 MG/ML IJ SOLN
0.2500 mg | INTRAMUSCULAR | Status: DC | PRN
  Administered 2024-01-27 (×2): 0.5 mg via INTRAVENOUS

## 2024-01-27 MED ORDER — ONDANSETRON HCL 4 MG/2ML IJ SOLN
INTRAMUSCULAR | Status: DC | PRN
  Administered 2024-01-27: 4 mg via INTRAVENOUS

## 2024-01-27 MED ORDER — KETOROLAC TROMETHAMINE 30 MG/ML IJ SOLN
INTRAMUSCULAR | Status: DC | PRN
  Administered 2024-01-27: 30 mg via INTRAVENOUS

## 2024-01-27 MED ORDER — 0.9 % SODIUM CHLORIDE (POUR BTL) OPTIME
TOPICAL | Status: DC | PRN
  Administered 2024-01-27: 500 mL

## 2024-01-27 MED ORDER — HYDROMORPHONE HCL 1 MG/ML IJ SOLN
INTRAMUSCULAR | Status: AC
  Filled 2024-01-27: qty 1

## 2024-01-27 MED ORDER — KETAMINE HCL 50 MG/5ML IJ SOSY
PREFILLED_SYRINGE | INTRAMUSCULAR | Status: DC | PRN
  Administered 2024-01-27: 10 mg via INTRAVENOUS
  Administered 2024-01-27: 30 mg via INTRAVENOUS

## 2024-01-27 MED ORDER — ROCURONIUM BROMIDE 100 MG/10ML IV SOLN
INTRAVENOUS | Status: DC | PRN
  Administered 2024-01-27: 20 mg via INTRAVENOUS
  Administered 2024-01-27: 10 mg via INTRAVENOUS
  Administered 2024-01-27: 50 mg via INTRAVENOUS

## 2024-01-27 MED ORDER — CEFAZOLIN SODIUM-DEXTROSE 2-4 GM/100ML-% IV SOLN
INTRAVENOUS | Status: AC
  Filled 2024-01-27: qty 100

## 2024-01-27 MED ORDER — EPHEDRINE SULFATE-NACL 50-0.9 MG/10ML-% IV SOSY
PREFILLED_SYRINGE | INTRAVENOUS | Status: DC | PRN
  Administered 2024-01-27: 10 mg via INTRAVENOUS

## 2024-01-27 MED ORDER — LIDOCAINE HCL (PF) 2 % IJ SOLN
INTRAMUSCULAR | Status: AC
  Filled 2024-01-27: qty 5

## 2024-01-27 MED ORDER — MEPERIDINE HCL 25 MG/ML IJ SOLN: 6.2500 mg | INTRAMUSCULAR | Status: DC | PRN

## 2024-01-27 MED ORDER — PROPOFOL 10 MG/ML IV BOLUS
INTRAVENOUS | Status: AC
  Filled 2024-01-27: qty 20

## 2024-01-27 MED ORDER — SUGAMMADEX SODIUM 200 MG/2ML IV SOLN
INTRAVENOUS | Status: DC | PRN
  Administered 2024-01-27: 200 mg via INTRAVENOUS

## 2024-01-27 MED ORDER — LIDOCAINE HCL (CARDIAC) PF 100 MG/5ML IV SOSY
PREFILLED_SYRINGE | INTRAVENOUS | Status: DC | PRN
  Administered 2024-01-27: 100 mg via INTRAVENOUS

## 2024-01-27 MED ORDER — OXYCODONE HCL 5 MG PO TABS
5.0000 mg | ORAL_TABLET | Freq: Once | ORAL | Status: AC | PRN
  Administered 2024-01-27: 5 mg via ORAL

## 2024-01-27 MED ORDER — FENTANYL CITRATE (PF) 100 MCG/2ML IJ SOLN
INTRAMUSCULAR | Status: AC
  Filled 2024-01-27: qty 2

## 2024-01-27 MED ORDER — LACTATED RINGERS IV SOLN: INTRAVENOUS | Status: DC

## 2024-01-27 MED ORDER — BUPIVACAINE-EPINEPHRINE (PF) 0.5% -1:200000 IJ SOLN
INTRAMUSCULAR | Status: AC
  Filled 2024-01-27: qty 30

## 2024-01-27 MED ORDER — OXYCODONE HCL 5 MG/5ML PO SOLN: 5.0000 mg | Freq: Once | ORAL | Status: AC | PRN

## 2024-01-27 MED ORDER — ACETAMINOPHEN 10 MG/ML IV SOLN
INTRAVENOUS | Status: DC | PRN
  Administered 2024-01-27: 1000 mg via INTRAVENOUS

## 2024-01-27 MED ORDER — PHENYLEPHRINE 80 MCG/ML (10ML) SYRINGE FOR IV PUSH (FOR BLOOD PRESSURE SUPPORT)
PREFILLED_SYRINGE | INTRAVENOUS | Status: DC | PRN
  Administered 2024-01-27: 80 ug via INTRAVENOUS

## 2024-01-27 MED ORDER — GLYCOPYRROLATE 0.2 MG/ML IJ SOLN
INTRAMUSCULAR | Status: AC
  Filled 2024-01-27: qty 1

## 2024-01-27 MED ORDER — MIDAZOLAM HCL 2 MG/2ML IJ SOLN
INTRAMUSCULAR | Status: AC
  Filled 2024-01-27: qty 2

## 2024-01-27 MED ORDER — KETAMINE HCL 50 MG/5ML IJ SOSY
PREFILLED_SYRINGE | INTRAMUSCULAR | Status: AC
  Filled 2024-01-27: qty 5

## 2024-01-27 MED ORDER — DEXAMETHASONE SOD PHOSPHATE PF 10 MG/ML IJ SOLN
INTRAMUSCULAR | Status: DC | PRN
  Administered 2024-01-27: 10 mg via INTRAVENOUS

## 2024-01-27 MED ORDER — FENTANYL CITRATE (PF) 100 MCG/2ML IJ SOLN
INTRAMUSCULAR | Status: DC | PRN
  Administered 2024-01-27: 100 ug via INTRAVENOUS

## 2024-01-27 MED ORDER — DEXMEDETOMIDINE HCL IN NACL 80 MCG/20ML IV SOLN
INTRAVENOUS | Status: AC
  Filled 2024-01-27: qty 20

## 2024-01-27 MED ORDER — ROCURONIUM BROMIDE 10 MG/ML (PF) SYRINGE
PREFILLED_SYRINGE | INTRAVENOUS | Status: AC
  Filled 2024-01-27: qty 10

## 2024-01-27 MED ORDER — DEXMEDETOMIDINE HCL IN NACL 80 MCG/20ML IV SOLN
INTRAVENOUS | Status: DC | PRN
  Administered 2024-01-27: 8 ug via INTRAVENOUS
  Administered 2024-01-27: 4 ug via INTRAVENOUS
  Administered 2024-01-27: 8 ug via INTRAVENOUS

## 2024-01-27 MED ORDER — OXYCODONE HCL 5 MG PO TABS
ORAL_TABLET | ORAL | Status: AC
  Filled 2024-01-27: qty 1

## 2024-01-27 MED ORDER — ACETAMINOPHEN 10 MG/ML IV SOLN
INTRAVENOUS | Status: AC
  Filled 2024-01-27: qty 100

## 2024-01-27 MED ORDER — BUPIVACAINE-EPINEPHRINE (PF) 0.5% -1:200000 IJ SOLN
INTRAMUSCULAR | Status: DC | PRN
  Administered 2024-01-27: 30 mL via PERINEURAL

## 2024-01-27 MED ORDER — MIDAZOLAM HCL (PF) 2 MG/2ML IJ SOLN
INTRAMUSCULAR | Status: DC | PRN
  Administered 2024-01-27: 2 mg via INTRAVENOUS

## 2024-01-27 MED ORDER — ORAL CARE MOUTH RINSE: 15.0000 mL | Freq: Once | OROMUCOSAL | Status: DC

## 2024-01-27 MED ORDER — CHLORHEXIDINE GLUCONATE 0.12 % MT SOLN: 15.0000 mL | Freq: Once | OROMUCOSAL | Status: DC

## 2024-01-27 MED ORDER — DEXAMETHASONE SOD PHOSPHATE PF 10 MG/ML IJ SOLN
INTRAMUSCULAR | Status: AC
  Filled 2024-01-27: qty 1

## 2024-01-27 MED ORDER — CEFAZOLIN SODIUM-DEXTROSE 2-3 GM-%(50ML) IV SOLR
INTRAVENOUS | Status: DC | PRN
  Administered 2024-01-27: 2 g via INTRAVENOUS

## 2024-01-27 MED ORDER — ONDANSETRON HCL 4 MG/2ML IJ SOLN
INTRAMUSCULAR | Status: AC
  Filled 2024-01-27: qty 2

## 2024-01-27 MED ORDER — OXYCODONE HCL 5 MG PO TABS: 5.0000 mg | ORAL_TABLET | Freq: Four times a day (QID) | ORAL | 0 refills | Status: DC | PRN

## 2024-01-27 SURGICAL SUPPLY — 33 items
BAG PRESSURE INF REUSE 1000 (BAG) IMPLANT
COVER TIP SHEARS 8 DVNC (MISCELLANEOUS) ×1 IMPLANT
COVER WAND RF STERILE (DRAPES) ×1 IMPLANT
DEFOGGER SCOPE WARM SEASHARP (MISCELLANEOUS) ×1 IMPLANT
DERMABOND ADVANCED .7 DNX12 (GAUZE/BANDAGES/DRESSINGS) ×1 IMPLANT
DRAPE ARM DVNC X/XI (DISPOSABLE) ×3 IMPLANT
DRAPE COLUMN DVNC XI (DISPOSABLE) ×1 IMPLANT
DRIVER NDLE LRG 8 DVNC XI (INSTRUMENTS) ×1 IMPLANT
ELECTRODE REM PT RTRN 9FT ADLT (ELECTROSURGICAL) ×1 IMPLANT
FORCEPS BPLR FENES DVNC XI (FORCEP) ×1 IMPLANT
FORCEPS BPLR R/ABLATION 8 DVNC (INSTRUMENTS) ×1 IMPLANT
GLOVE BIOGEL PI IND STRL 7.5 (GLOVE) ×2 IMPLANT
GLOVE SURG SYN 7.0 PF PI (GLOVE) ×2 IMPLANT
GOWN STRL REUS W/ TWL LRG LVL3 (GOWN DISPOSABLE) ×3 IMPLANT
IRRIGATOR SUCT 8 DISP DVNC XI (IRRIGATION / IRRIGATOR) IMPLANT
IV 0.9% NACL 1000 ML (IV SOLUTION) IMPLANT
KIT PINK PAD W/HEAD ARM REST (MISCELLANEOUS) ×1 IMPLANT
LABEL OR SOLS (LABEL) IMPLANT
MANIFOLD NEPTUNE II (INSTRUMENTS) ×1 IMPLANT
MESH PROGRIP LAP SEF FIX 15X10 (Mesh General) IMPLANT
NEEDLE HYPO 22X1.5 SAFETY MO (MISCELLANEOUS) ×1 IMPLANT
NEEDLE INSUFFLATION 14GA 120MM (NEEDLE) ×1 IMPLANT
OBTURATOR OPTICALSTD 8 DVNC (TROCAR) ×1 IMPLANT
PACK LAP CHOLECYSTECTOMY (MISCELLANEOUS) ×1 IMPLANT
SCISSORS MNPLR CVD DVNC XI (INSTRUMENTS) ×1 IMPLANT
SEAL UNIV 5-12 XI (MISCELLANEOUS) ×3 IMPLANT
SET TUBE SMOKE EVAC HIGH FLOW (TUBING) ×1 IMPLANT
SOLN STERILE WATER 500 ML (IV SOLUTION) ×1 IMPLANT
SOLUTION ELECTROSURG ANTI STCK (MISCELLANEOUS) ×1 IMPLANT
SUT STRATA 2-0 23CM CT-2 (SUTURE) ×1 IMPLANT
SUT VIC AB 2-0 SH 27XBRD (SUTURE) ×1 IMPLANT
SUTURE MNCRL 4-0 27XMF (SUTURE) ×1 IMPLANT
TAPE TRANSPORE STRL 2 31045 (GAUZE/BANDAGES/DRESSINGS) IMPLANT

## 2024-01-27 NOTE — Anesthesia Postprocedure Evaluation (Signed)
"   Anesthesia Post Note  Patient: TONY GRANQUIST  Procedure(s) Performed: HERNIORRHAPHY, INGUINAL, ROBOT-ASSISTED, LAPAROSCOPIC (Right: Groin) INSERTION OF MESH (Right: Groin)  Patient location during evaluation: PACU Anesthesia Type: General Level of consciousness: awake and awake and alert Pain management: pain level controlled Vital Signs Assessment: post-procedure vital signs reviewed and stable Respiratory status: spontaneous breathing Cardiovascular status: blood pressure returned to baseline Postop Assessment: no apparent nausea or vomiting Anesthetic complications: no   No notable events documented.   Last Vitals:  Vitals:   01/27/24 1215 01/27/24 1231  BP: 116/82 111/76  Pulse: 76 65  Resp: 11 16  Temp:  36.4 C  SpO2: 100% 99%    Last Pain:  Vitals:   01/27/24 1231  TempSrc: Temporal  PainSc: 3                  Babe Anthis S Voncille Simm      "

## 2024-01-27 NOTE — H&P (Signed)
 No changes to below H and P, proceed as planned with RIGHT possible left inguinal hernia repair with mesh, robotic  MRN: [id number] CC: Right Inguinal Hernia History of Present Illness Mark Butler is a 24 y.o. male with no significant medical history that presents to consultation for right inguinal hernia.  The patient reports that he has had pain over the last month in his right groin.  He also says that his right scrotum is bigger now.  He reports that lifting exacerbates the pain.  He denies any overlying skin changes and denies any obstipation symptoms..   Past Medical History     Past Medical History:  Diagnosis Date   Right inguinal hernia                   Past Surgical History:  Procedure Laterality Date   FOREIGN BODY REMOVAL EAR       KNEE ARTHROSCOPY WITH MENISCAL REPAIR Left 11/25/2014    Procedure: KNEE ARTHROSCOPY WITH MENISCAL REPAIR;  Surgeon: Norleen JINNY [name], MD;  Location: [facility] ORS;  Service: Orthopedics;  Laterality: Left;   PATELLAR TENDON REPAIR Left 11/25/2014    Procedure: PATELLA TENDON REPAIR/ PATELLOFEMORAL LIGAMENT;  Surgeon: Norleen JINNY [name], MD;  Location: [facility] ORS;  Service: Orthopedics;  Laterality: Left;          [Allergies]  [Allergies] No Known Allergies   No current outpatient medications on file.      No current facility-administered medications for this visit.        Family History History reviewed. No pertinent family history.          Social History [Social History]   [Social History]      Tobacco Use   Smoking status: Never   Smokeless tobacco: Current   Tobacco comments:      Nicotine pouches   Vaping Use   Vaping status: Every Day   Substances: Nicotine, Flavoring   Devices: Nicotine  Substance Use Topics   Alcohol use: Yes      Alcohol/week: 4.0 - 5.0 standard drinks of alcohol      Types: 4 - 5 Standard drinks or equivalent per week      Comment: beer   Drug use: Not Currently      Types: Marijuana          ROS Full ROS of systems performed and is otherwise negative there than what is stated in the HPI   Physical Exam Blood pressure 116/76, pulse 72, temperature 98.3 F (36.8 C), temperature source Oral, height 5' 10 (1.778 m), weight 182 lb 6.4 oz (82.7 kg), SpO2 98%.   Alert and oriented x 3, normal work of breathing on room air, regular rate and rhythm, abdomen soft, nontender nondistended, right groin with obvious inguinal hernia that is reducible.  No overlying skin changes but there is some pain with reduction   Data Reviewed Ultrasound independently reviewed and he does have a fat-containing right inguinal hernia.   I have personally reviewed the patient's imaging and medical records.     Assessment Assessment Patient with right inguinal hernia   Plan Plan Plan for robotic assisted right inguinal hernia possible left inguinal hernia repair.  I discussed the risk, benefits alternatives of the procedure including risk of infection, bleeding, damage to the vas deferens, testicular ischemia, chronic pain and hernia recurrence.  He understands his risk and wishes to proceed with surgery       [name]

## 2024-01-27 NOTE — Transfer of Care (Signed)
 Immediate Anesthesia Transfer of Care Note  Patient: Mark Butler  Procedure(s) Performed: HERNIORRHAPHY, INGUINAL, ROBOT-ASSISTED, LAPAROSCOPIC (Right: Groin) INSERTION OF MESH (Right: Groin)  Patient Location: PACU  Anesthesia Type:General  Level of Consciousness: drowsy  Airway & Oxygen Therapy: Patient Spontanous Breathing and Patient connected to face mask oxygen  Post-op Assessment: Report given to RN and Post -op Vital signs reviewed and stable  Post vital signs: stable  Last Vitals:  Vitals Value Taken Time  BP 114/71 01/27/24 11:26  Temp    Pulse 72 01/27/24 11:29  Resp 14 01/27/24 11:29  SpO2 100 % 01/27/24 11:29  Vitals shown include unfiled device data.  Last Pain:  Vitals:   01/27/24 0847  TempSrc: Temporal  PainSc: 0-No pain         Complications: No notable events documented.

## 2024-01-27 NOTE — Anesthesia Procedure Notes (Signed)
 Procedure Name: Intubation Date/Time: 01/27/2024 9:35 AM  Performed by: Myra Lawless, CRNAPre-anesthesia Checklist: Patient identified, Patient being monitored, Timeout performed, Emergency Drugs available and Suction available Patient Re-evaluated:Patient Re-evaluated prior to induction Oxygen Delivery Method: Circle system utilized Preoxygenation: Pre-oxygenation with 100% oxygen Induction Type: IV induction Ventilation: Mask ventilation without difficulty Laryngoscope Size: Mac and 3 Grade View: Grade I Tube type: Oral Tube size: 7.5 mm Number of attempts: 1 Airway Equipment and Method: Stylet Placement Confirmation: ETT inserted through vocal cords under direct vision, positive ETCO2 and breath sounds checked- equal and bilateral Secured at: 22 cm Tube secured with: Tape Dental Injury: Teeth and Oropharynx as per pre-operative assessment

## 2024-01-27 NOTE — Op Note (Signed)
 Procedure Date:  01/27/2024  Pre-operative Diagnosis:  Right Inguinal Hernia  Post-operative Diagnosis: Same  Procedure: 1.  Robotic assisted Right Inguinal Hernia Repair 2.  Creation of righ Posterior Rectus-Transversalis Fascia Advancment Flap for Coverage of Pelvic Wound (200 cm)  Surgeon:  Jayson Endow, M.D.   Anesthesia:  General endotracheal  Estimated Blood Loss:  10 ml  Specimens:  None  Complications:  none  Indications for Procedure:  This is a 24 y.o. male who presents with a right inguinal hernia.  The options of surgery versus observation were reviewed with the patient and/or family. The risks of bleeding, abscess or infection, recurrence of symptoms, potential for an open procedure, injury to surrounding structures, and chronic pain were all discussed with the patient and he was willing to proceed.  We have planned this transabdominal procedure with the creation of right peritoneal flap based on the posterior rectus sheath and transversalis fascia in order to fully cover the mesh, creating a natural tisssue barrier for the bowel and peritoneal cavity.  Description of Procedure: The patient was correctly identified in the preoperative area and brought into the operating room.  The patient was placed supine with VTE prophylaxis in place.  Appropriate time-outs were performed.  Anesthesia was induced and the patient was intubated.   Appropriate antibiotics were infused.  The abdomen was prepped and draped in a sterile fashion. An incision was made at [place] and a veress needle was placed into the abdomen using standard drop technique. Pneumoperitoneum was then established. Using an optiview trocar a supra-umbilical robotic port was placed. No injury was noted at veress needle insertion site. Two 8-mm robotic ports were placed in the right and left lateral positions under direct visualization.    The [employer] platform was docked onto the patient, the camera was inserted and  targeted, and the instruments were placed under direct visualization.  Both inguinal regions were inspected for hernias and it was confirmed that the patient had a right inguinal hernia.  Using electocautery, the peritoneal and posterior rectus tissue flap was created.  The peritoneum on the right side was scored from the median umbilical ligament laterally towards the ASIS.  The flap was mobilized using robotic scissors and the bipolar instruments, creating a plane along the posterior rectus sheath and transversalis fascia down to the pubic tubercle medially. It was then further mobilized laterally across the inguinal canal and femoral vessels and onto the psoas muscle. The inferior epigastric vessels were identified and preserved. This created a posterior rectus and peritoneal flap measuring roughly 17 cm x 12 cm.  The hernia sac and contents were reduced preserving all structures. The patient had a indirect right hernia defect. A large pro-grip polypropelene mesh was then inserted into the abdomen along with a 2-0 v-lock suture. The mesh was placed into the pre-peritoneal space and there was good coverage of all hernia spaces. Then, the peritoneal flap was advanced over the mesh and carried over to close the defect. A running 2-0 V lock suture was used to approximate the edge of the flap onto the peritoneum.    All needles were removed under direct visualization.  The 8- mm ports were removed under direct visualization and the Hasson trocar was removed.   Local anesthetic was infused in all incisions  and the incisions were closed with 4-0 Monocryl.  The wounds were cleaned and sealed with DermaBond. The patient was emerged from anesthesia and extubated and brought to the recovery room for further management.  The patient tolerated the procedure well and all counts were correct at the end of the case.   Jayson Endow, M.D.

## 2024-01-27 NOTE — Anesthesia Preprocedure Evaluation (Signed)
"                                    Anesthesia Evaluation  Patient identified by MRN, date of birth, ID band Patient awake    Reviewed: Allergy & Precautions, H&P , Patient's Chart, lab work & pertinent test results, reviewed documented beta blocker date and time   Airway Mallampati: I  TM Distance: >3 FB Neck ROM: Full    Dental no notable dental hx.    Pulmonary neg pulmonary ROS + vapes no cigarettes   Pulmonary exam normal        Cardiovascular Exercise Tolerance: Good negative cardio ROS Normal cardiovascular exam     Neuro/Psych negative neurological ROS  negative psych ROS   GI/Hepatic negative GI ROS, Neg liver ROS,,,  Endo/Other  negative endocrine ROS    Renal/GU negative Renal ROS  negative genitourinary   Musculoskeletal   Abdominal Normal abdominal exam  (+)   Peds  Hematology negative hematology ROS (+)   Anesthesia Other Findings 08/23/23 08:26 Comprehensive metabolic panel with GFR: Rpt ! Sodium: 139 Potassium: 4.4 Chloride: 98 CO2: 32 Glucose: 79 BUN: 9 Creatinine: 0.90 Calcium: 9.6 Alkaline Phosphatase: 130 (H) Albumin: 4.7 AST: 20 ALT: 18 Total Protein: 7.2 Total Bilirubin: 0.7 GFR: 119.76 Total CHOL/HDL Ratio: 3 Cholesterol: 868 HDL Cholesterol: 45.70 LDL (calc): 67 NonHDL: 84.93 Triglycerides: 10.9 VLDL: 17.8 VITD: 29.30 (L) Vitamin B12: 483 WBC: 6.0 RBC: 5.24 Hemoglobin: 16.5 HCT: 47.4 MCV: 90.4 MCHC: 34.8 RDW: 12.8 Platelets: 181.0 Neutrophils: 52.0 Lymphocytes: 31.4 Monocytes Relative: 11.9 Eosinophil: 3.6 Basophil: 1.1 NEUT#: 3.1 Lymphs Abs: 1.9 Monocyte #: 0.7 Eosinophils Absolute: 0.2 Basophils Absolute: 0.1 Hemoglobin A1C: 5.2 Testosterone : 593.46 TSH: 1.11 Chlamydia: Negative Neisseria Gonorrhea: Negative RPR: NON-REACTIVE Trichomonas: Negative Hepatitis C Ab: NON-REACTIVE HIV: NON-REACTIVE Urine cytology ancillary only: Rpt (C) HIV FINAL INTERPRETATION: Pend   !: Data is  abnormal (H): Data is abnormally high (L): Data is abnormally low (C): Corrected Rpt: View report in Results Review for more information  Reproductive/Obstetrics negative OB ROS                              Anesthesia Physical Anesthesia Plan  ASA: 2  Anesthesia Plan: General and General ETT   Post-op Pain Management:    Induction:   PONV Risk Score and Plan:   Airway Management Planned: Oral ETT  Additional Equipment:   Intra-op Plan:   Post-operative Plan:   Informed Consent: I have reviewed the patients History and Physical, chart, labs and discussed the procedure including the risks, benefits and alternatives for the proposed anesthesia with the patient or authorized representative who has indicated his/her understanding and acceptance.       Plan Discussed with: CRNA  Anesthesia Plan Comments:          Anesthesia Quick Evaluation  "

## 2024-01-27 NOTE — Addendum Note (Signed)
 Addendum  created 01/27/24 1327 by Myra Lawless, CRNA   Flowsheet accepted, Intraprocedure Event edited, Intraprocedure Flowsheets edited

## 2024-01-27 NOTE — Telephone Encounter (Addendum)
Form done, please scan and send.  Thanks.  

## 2024-01-28 ENCOUNTER — Encounter: Payer: Self-pay | Admitting: General Surgery

## 2024-01-28 NOTE — Telephone Encounter (Signed)
 Called pt to informed pt the his forms are ready for pick up. l informed the patient that his return to work needs to be from his surgeon's office with detail on work restrictions.

## 2024-01-28 NOTE — Telephone Encounter (Signed)
 Spoke to pt. He had surgery yesterday. So, his surgeon will be doing the paperwork

## 2024-01-29 ENCOUNTER — Telehealth: Payer: Self-pay | Admitting: Nurse Practitioner

## 2024-01-29 NOTE — Telephone Encounter (Signed)
 Received [employer] via [employer] and placed in providers in box

## 2024-01-30 NOTE — Telephone Encounter (Signed)
 Forms are not in [employer] in box up front. There are forms that have already been filled out by Dr. Cleatus that are ready for the pt to pick up, they are up front. Amy B, CMA called the pt on 01/28/24 and made him aware of this.

## 2024-01-30 NOTE — Telephone Encounter (Signed)
 I have messaged my pool earlier stating that I would need to see him since he was evaluated by Dr. Cleatus. Or if Dr. Cleatus feels comfortable he can feel the from out

## 2024-02-11 ENCOUNTER — Encounter: Payer: Self-pay | Admitting: General Surgery

## 2024-02-11 ENCOUNTER — Ambulatory Visit: Admitting: General Surgery

## 2024-02-11 VITALS — BP 110/56 | HR 89 | Ht 70.0 in | Wt 180.0 lb

## 2024-02-11 DIAGNOSIS — Z09 Encounter for follow-up examination after completed treatment for conditions other than malignant neoplasm: Secondary | ICD-10-CM

## 2024-02-11 DIAGNOSIS — K4090 Unilateral inguinal hernia, without obstruction or gangrene, not specified as recurrent: Secondary | ICD-10-CM

## 2024-02-11 NOTE — Patient Instructions (Signed)

## 2024-02-11 NOTE — Progress Notes (Signed)
 Patient returns today 2 weeks postop from robotic assisted bilateral inguinal hernia repair.  He reports doing well.  He denies any pain.  He is tolerating a diet and having normal bowel function.  He says that there was some swelling in the right testicle but that has reduced significantly.  His incisions are healing well.  On exam his incisions stopped glue on them but there is no erythema.  Some swelling to the right groin but no evidence of recurrence with Valsalva on bilateral sides.  I discussed continued lifting restrictions of no greater than 10 to 15 pounds for another 4 weeks.  He can submerge the wounds in water.  He can use scar cream once the glue falls off.  He can follow-up in our office as needed.  I have filled out a form for him to return to work this Thursday with lifting restrictions for another 4 weeks.
